# Patient Record
Sex: Male | Born: 1960 | Race: White | Hispanic: No | Marital: Married | Smoking: Never smoker
Health system: Southern US, Community
[De-identification: ages and names within clinical notes are randomized; demographics above are authoritative.]

## PROBLEM LIST (undated history)

## (undated) DIAGNOSIS — N200 Calculus of kidney: Secondary | ICD-10-CM

## (undated) DIAGNOSIS — E119 Type 2 diabetes mellitus without complications: Secondary | ICD-10-CM

---

## 2017-01-26 ENCOUNTER — Encounter (HOSPITAL_BASED_OUTPATIENT_CLINIC_OR_DEPARTMENT_OTHER): Payer: Self-pay | Admitting: Emergency Medicine

## 2017-01-26 ENCOUNTER — Other Ambulatory Visit: Payer: Self-pay

## 2017-01-26 DIAGNOSIS — E119 Type 2 diabetes mellitus without complications: Secondary | ICD-10-CM | POA: Insufficient documentation

## 2017-01-26 DIAGNOSIS — Z87442 Personal history of urinary calculi: Secondary | ICD-10-CM | POA: Diagnosis not present

## 2017-01-26 DIAGNOSIS — Z7982 Long term (current) use of aspirin: Secondary | ICD-10-CM | POA: Insufficient documentation

## 2017-01-26 DIAGNOSIS — N1330 Unspecified hydronephrosis: Secondary | ICD-10-CM | POA: Diagnosis not present

## 2017-01-26 DIAGNOSIS — N12 Tubulo-interstitial nephritis, not specified as acute or chronic: Secondary | ICD-10-CM | POA: Diagnosis not present

## 2017-01-26 DIAGNOSIS — R109 Unspecified abdominal pain: Secondary | ICD-10-CM | POA: Diagnosis not present

## 2017-01-26 DIAGNOSIS — R319 Hematuria, unspecified: Secondary | ICD-10-CM | POA: Insufficient documentation

## 2017-01-26 DIAGNOSIS — R1084 Generalized abdominal pain: Secondary | ICD-10-CM | POA: Diagnosis not present

## 2017-01-26 LAB — URINALYSIS, MICROSCOPIC (REFLEX)

## 2017-01-26 LAB — URINALYSIS, ROUTINE W REFLEX MICROSCOPIC
Glucose, UA: NEGATIVE mg/dL
Ketones, ur: 40 mg/dL — AB
Leukocytes, UA: NEGATIVE
Nitrite: NEGATIVE
Specific Gravity, Urine: >1.03 — ABNORMAL HIGH (ref 1.005–1.030)
pH: 5.5 (ref 5.0–8.0)

## 2017-01-26 NOTE — ED Triage Notes (Signed)
Pt presents with right flank pain , abdominal pain, right groin pain and blood in urine. PT reports pain started Monday and he flew back from [facility] and started having pink urine tonight.

## 2017-01-27 ENCOUNTER — Emergency Department (HOSPITAL_BASED_OUTPATIENT_CLINIC_OR_DEPARTMENT_OTHER)
Admission: EM | Admit: 2017-01-27 | Discharge: 2017-01-27 | Disposition: A | Discharge status: Home / Self Care | Payer: BLUE CROSS/BLUE SHIELD | Attending: Emergency Medicine | Admitting: Emergency Medicine

## 2017-01-27 ENCOUNTER — Emergency Department (HOSPITAL_BASED_OUTPATIENT_CLINIC_OR_DEPARTMENT_OTHER): Payer: BLUE CROSS/BLUE SHIELD

## 2017-01-27 DIAGNOSIS — R319 Hematuria, unspecified: Secondary | ICD-10-CM

## 2017-01-27 DIAGNOSIS — N1330 Unspecified hydronephrosis: Secondary | ICD-10-CM | POA: Diagnosis not present

## 2017-01-27 DIAGNOSIS — N12 Tubulo-interstitial nephritis, not specified as acute or chronic: Secondary | ICD-10-CM

## 2017-01-27 DIAGNOSIS — R109 Unspecified abdominal pain: Secondary | ICD-10-CM

## 2017-01-27 DIAGNOSIS — Z87442 Personal history of urinary calculi: Secondary | ICD-10-CM

## 2017-01-27 HISTORY — DX: Type 2 diabetes mellitus without complications: E11.9

## 2017-01-27 LAB — COMPREHENSIVE METABOLIC PANEL
ALT: 18 U/L (ref 17–63)
AST: 19 U/L (ref 15–41)
Albumin: 4.1 g/dL (ref 3.5–5.0)
Alkaline Phosphatase: 40 U/L (ref 38–126)
Anion gap: 10 (ref 5–15)
BILIRUBIN TOTAL: 0.8 mg/dL (ref 0.3–1.2)
BUN: 33 mg/dL — AB (ref 6–20)
CALCIUM: 9.2 mg/dL (ref 8.9–10.3)
CO2: 25 mmol/L (ref 22–32)
CREATININE: 2.11 mg/dL — AB (ref 0.61–1.24)
Chloride: 100 mmol/L — ABNORMAL LOW (ref 101–111)
GFR, EST AFRICAN AMERICAN: 39 mL/min — AB (ref >60)
GFR, EST NON AFRICAN AMERICAN: 34 mL/min — AB (ref >60)
Glucose, Bld: 113 mg/dL — ABNORMAL HIGH (ref 65–99)
Potassium: 4 mmol/L (ref 3.5–5.1)
Sodium: 135 mmol/L (ref 135–145)
TOTAL PROTEIN: 7.7 g/dL (ref 6.5–8.1)

## 2017-01-27 LAB — CBC
HEMATOCRIT: 43.8 % (ref 39.0–52.0)
Hemoglobin: 15.3 g/dL (ref 13.0–17.0)
MCH: 30.8 pg (ref 26.0–34.0)
MCHC: 34.9 g/dL (ref 30.0–36.0)
MCV: 88.1 fL (ref 78.0–100.0)
PLATELETS: 126 10*3/uL — AB (ref 150–400)
RBC: 4.97 MIL/uL (ref 4.22–5.81)
RDW: 13 % (ref 11.5–15.5)
WBC: 10.1 10*3/uL (ref 4.0–10.5)

## 2017-01-27 LAB — LIPASE, BLOOD: LIPASE: 43 U/L (ref 11–51)

## 2017-01-27 MED ORDER — MORPHINE SULFATE (PF) 4 MG/ML IV SOLN
4.0000 mg | Freq: Once | INTRAVENOUS | Status: AC
  Administered 2017-01-27: 4 mg via INTRAVENOUS
  Filled 2017-01-27: qty 1

## 2017-01-27 MED ORDER — CEPHALEXIN 500 MG PO CAPS: 500.0000 mg | ORAL_CAPSULE | Freq: Three times a day (TID) | ORAL | 0 refills | Status: DC

## 2017-01-27 MED ORDER — DOCUSATE SODIUM 250 MG PO CAPS: 250.0000 mg | ORAL_CAPSULE | Freq: Every day | ORAL | 0 refills | Status: DC

## 2017-01-27 MED ORDER — SODIUM CHLORIDE 0.9 % IV BOLUS (SEPSIS)
1000.0000 mL | Freq: Once | INTRAVENOUS | Status: AC
  Administered 2017-01-27: 1000 mL via INTRAVENOUS

## 2017-01-27 MED ORDER — HYDROCODONE-ACETAMINOPHEN 5-325 MG PO TABS: 1.0000 | ORAL_TABLET | ORAL | 0 refills | Status: DC | PRN

## 2017-01-27 MED ORDER — ONDANSETRON 8 MG PO TBDP: 8.0000 mg | ORAL_TABLET | Freq: Three times a day (TID) | ORAL | 0 refills | Status: DC | PRN

## 2017-01-27 MED ORDER — ONDANSETRON HCL 4 MG/2ML IJ SOLN
4.0000 mg | Freq: Once | INTRAMUSCULAR | Status: AC
  Administered 2017-01-27: 4 mg via INTRAVENOUS
  Filled 2017-01-27: qty 2

## 2017-01-27 MED ORDER — CEFTRIAXONE SODIUM 1 G IJ SOLR
1.0000 g | Freq: Once | INTRAMUSCULAR | Status: AC
  Administered 2017-01-27: 1 g via INTRAVENOUS
  Filled 2017-01-27: qty 10

## 2017-01-27 MED ORDER — IOPAMIDOL (ISOVUE-300) INJECTION 61%: 100.0000 mL | Freq: Once | INTRAVENOUS | Status: DC | PRN

## 2017-01-27 NOTE — Discharge Instructions (Signed)
You will need your kidney function rechecked in 1 week

## 2017-01-27 NOTE — ED Provider Notes (Signed)
[place] EMERGENCY DEPARTMENT Provider Note   CSN: [id number] Arrival date & time: 01/26/17  2331     History   Chief Complaint Chief Complaint  Patient presents with  . Abdominal Pain    HPI William Fry is a 57 y.o. male.  HPI Patient is a 57 year old male presents the emergency department several days of abdominal pain as well as urinary frequency.  He is also had new right flank pain with pain down into his right groin and tonight noticed new blood in his urine.  He is never had a history of kidney stones.  He reports several days ago he had severe abdominal distention with generalized crampy abdominal pain.  He reports urinary frequency without dysuria through the week.  He is never had symptoms like this before.  Reports decreased oral intake over the past several days.  No documented fevers or chills.  Reports some nausea without vomiting.  Denies diarrhea.  Symptoms are moderate in severity.    Past Medical History:  Diagnosis Date  . Diabetes mellitus without complication ([place])     There are no active problems to display for this patient.   History reviewed. No pertinent surgical history.     Home Medications    Prior to Admission medications   Medication Sig Start Date End Date Taking? Authorizing Provider  aspirin EC 81 MG tablet Take 81 mg by mouth daily.   Yes [provider]  fluticasone (FLONASE) 50 MCG/ACT nasal spray Place into both nostrils daily.   Yes [provider]  Omega-3 Fatty Acids (FISH OIL) 1000 MG CAPS Take by mouth.   Yes [provider]  cephALEXin (KEFLEX) 500 MG capsule Take 1 capsule (500 mg total) by mouth 3 (three) times daily. 01/27/17   Jola Schmidt, MD  docusate sodium (COLACE) 250 MG capsule Take 1 capsule (250 mg total) by mouth daily. 01/27/17   Jola Schmidt, MD  HYDROcodone-acetaminophen (NORCO/VICODIN) 5-325 MG tablet Take 1 tablet by mouth every 4 (four) hours as needed for moderate pain.  01/27/17   Jola Schmidt, MD  ondansetron (ZOFRAN ODT) 8 MG disintegrating tablet Take 1 tablet (8 mg total) by mouth every 8 (eight) hours as needed for nausea or vomiting. 01/27/17   Jola Schmidt, MD    Family History No family history on file.  Social History Social History   Tobacco Use  . Smoking status: Never Smoker  . Smokeless tobacco: Never Used  Substance Use Topics  . Alcohol use: No    Frequency: Never  . Drug use: No     Allergies   Patient has no known allergies.   Review of Systems Review of Systems  All other systems reviewed and are negative.    Physical Exam Updated Vital Signs BP 118/67 (BP Location: Left Arm)   Pulse 64   Temp 98.4 F (36.9 C) (Oral)   Resp 14   Ht 6' (1.829 m)   Wt 94.3 kg (208 lb)   SpO2 96%   BMI 28.21 kg/m   Physical Exam  Constitutional: He is oriented to person, place, and time. He appears well-developed and well-nourished.  HENT:  Head: Normocephalic and atraumatic.  Eyes: EOM are normal.  Neck: Normal range of motion.  Cardiovascular: Normal rate, regular rhythm, normal heart sounds and intact distal pulses.  Pulmonary/Chest: Effort normal and breath sounds normal. No respiratory distress.  Abdominal: Soft. He exhibits no distension. There is no tenderness.  Musculoskeletal: Normal range of motion.  Neurological:  He is alert and oriented to person, place, and time.  Skin: Skin is warm and dry.  Psychiatric: He has a normal mood and affect. Judgment normal.  Nursing note and vitals reviewed.    ED Treatments / Results  Labs (all labs ordered are listed, but only abnormal results are displayed) Labs Reviewed  URINALYSIS, ROUTINE W REFLEX MICROSCOPIC - Abnormal; Notable for the following components:      Result Value   Color, Urine BROWN (*)    APPearance CLOUDY (*)    Specific Gravity, Urine >1.030 (*)    Hgb urine dipstick LARGE (*)    Bilirubin Urine SMALL (*)    Ketones, ur 40 (*)    Protein, ur >300  (*)    All other components within normal limits  URINALYSIS, MICROSCOPIC (REFLEX) - Abnormal; Notable for the following components:   Bacteria, UA MANY (*)    Squamous Epithelial / LPF 0-5 (*)    All other components within normal limits  CBC - Abnormal; Notable for the following components:   Platelets 126 (*)    All other components within normal limits  COMPREHENSIVE METABOLIC PANEL - Abnormal; Notable for the following components:   Chloride 100 (*)    Glucose, Bld 113 (*)    BUN 33 (*)    Creatinine, Ser 2.11 (*)    GFR calc non Af Amer 34 (*)    GFR calc Af Amer 39 (*)    All other components within normal limits  URINE CULTURE  LIPASE, BLOOD    EKG  EKG Interpretation None       Radiology Ct Abdomen Pelvis Wo Contrast  Result Date: 01/27/2017 CLINICAL DATA:  Abdominal pain, right-sided flank and groin pain with hematuria EXAM: CT ABDOMEN AND PELVIS WITHOUT CONTRAST TECHNIQUE: Multidetector CT imaging of the abdomen and pelvis was performed following the standard protocol without IV contrast. COMPARISON:  None. FINDINGS: Lower chest: No acute abnormality. Hepatobiliary: No focal liver abnormality is seen. No gallstones, gallbladder wall thickening, or biliary dilatation. Pancreas: Unremarkable. No pancreatic ductal dilatation or surrounding inflammatory changes. Spleen: Normal in size without focal abnormality. Adrenals/Urinary Tract: Adrenal glands are within normal limits. There are intrarenal calculi bilaterally, measuring up to 4 mm midpole left, and 3 mm midpole right. Right perinephric fat stranding. Mild right hydronephrosis and hydroureter. No stones along the course of the right ureter. 3 mm stone within the dependent portion of the bladder. Stomach/Bowel: Stomach is within normal limits. Appendix appears normal. No evidence of bowel wall thickening, distention, or inflammatory changes. Sigmoid colon diverticular disease without acute inflammation. Vascular/Lymphatic:  Aortic atherosclerosis. No enlarged abdominal or pelvic lymph nodes. Reproductive: Prostate is unremarkable. Other: Negative for free air or free fluid. Small fat in the umbilical region. Musculoskeletal: No acute or significant osseous findings. IMPRESSION: 1. Mild right hydronephrosis and hydroureter; no stones seen along the course of the right ureter, however there is a 3 mm stone within the dependent portion of the bladder and findings are felt to be consistent with a recently passed stone. 2. There are intrarenal calculi bilaterally 3. Sigmoid colon diverticular disease without acute inflammation. Electronically Signed   By: Donavan Foil M.D.   On: 01/27/2017 01:52    Procedures Procedures (including critical care time)  Medications Ordered in ED Medications  morphine 4 MG/ML injection 4 mg (4 mg Intravenous Given 01/27/17 0054)  ondansetron (ZOFRAN) injection 4 mg (4 mg Intravenous Given 01/27/17 0053)  sodium chloride 0.9 % bolus 1,000 mL (0  mLs Intravenous Stopped 01/27/17 0308)  cefTRIAXone (ROCEPHIN) 1 g in dextrose 5 % 50 mL IVPB (0 g Intravenous Stopped 01/27/17 0308)     Initial Impression / Assessment and Plan / ED Course  I have reviewed the triage vital signs and the nursing notes.  Pertinent labs & imaging results that were available during my care of the patient were reviewed by me and considered in my medical decision making (see chart for details).     Urine sent for culture.  May have represented infected distal ureteral stone but now ureteral stone has passed.  He seems comfortable.  He feels better after symptomatic treatment here in the emergency department.  Urine culture sent.  Rocephin given in the emergency department.  Pain improved.  Nausea improved.  Feels much better.  Outpatient primary care follow-up.  He understands return to the ER for new or worsening symptoms.  Home with Keflex, Zofran, short course of pain medicine and stool softeners  Final Clinical  Impressions(s) / ED Diagnoses   Final diagnoses:  Acute abdominal pain  Pyelonephritis  Hematuria, unspecified type  History of renal stone    ED Discharge Orders        Ordered    ondansetron (ZOFRAN ODT) 8 MG disintegrating tablet  Every 8 hours PRN     01/27/17 0331    cephALEXin (KEFLEX) 500 MG capsule  3 times daily     01/27/17 0331    HYDROcodone-acetaminophen (NORCO/VICODIN) 5-325 MG tablet  Every 4 hours PRN     01/27/17 0331    docusate sodium (COLACE) 250 MG capsule  Daily     01/27/17 [place], Armida Vickroy, MD 01/27/17 [phone]

## 2017-01-28 LAB — URINE CULTURE: Culture: NO GROWTH

## 2017-02-02 DIAGNOSIS — N200 Calculus of kidney: Secondary | ICD-10-CM | POA: Diagnosis not present

## 2017-02-02 DIAGNOSIS — E782 Mixed hyperlipidemia: Secondary | ICD-10-CM | POA: Diagnosis not present

## 2017-02-02 DIAGNOSIS — N179 Acute kidney failure, unspecified: Secondary | ICD-10-CM | POA: Diagnosis not present

## 2017-02-02 DIAGNOSIS — E119 Type 2 diabetes mellitus without complications: Secondary | ICD-10-CM | POA: Diagnosis not present

## 2017-02-06 DIAGNOSIS — N202 Calculus of kidney with calculus of ureter: Secondary | ICD-10-CM | POA: Diagnosis not present

## 2017-02-24 DIAGNOSIS — N179 Acute kidney failure, unspecified: Secondary | ICD-10-CM | POA: Diagnosis not present

## 2017-05-04 DIAGNOSIS — E782 Mixed hyperlipidemia: Secondary | ICD-10-CM | POA: Diagnosis not present

## 2017-05-04 DIAGNOSIS — E119 Type 2 diabetes mellitus without complications: Secondary | ICD-10-CM | POA: Diagnosis not present

## 2017-05-04 DIAGNOSIS — Z0000 Encounter for general adult medical examination without abnormal findings: Secondary | ICD-10-CM | POA: Diagnosis not present

## 2017-05-04 DIAGNOSIS — Z125 Encounter for screening for malignant neoplasm of prostate: Secondary | ICD-10-CM | POA: Diagnosis not present

## 2017-12-18 DIAGNOSIS — R109 Unspecified abdominal pain: Secondary | ICD-10-CM | POA: Diagnosis not present

## 2018-01-16 DIAGNOSIS — D696 Thrombocytopenia, unspecified: Secondary | ICD-10-CM | POA: Diagnosis not present

## 2018-03-02 DIAGNOSIS — D696 Thrombocytopenia, unspecified: Secondary | ICD-10-CM | POA: Diagnosis not present

## 2018-03-21 ENCOUNTER — Telehealth: Payer: Self-pay | Admitting: Hematology

## 2018-03-21 NOTE — Telephone Encounter (Signed)
lmom for pt to return call to office re new patient appt. Mailed appt letter for 04/19/18 at 0830

## 2018-04-16 ENCOUNTER — Other Ambulatory Visit: Payer: Self-pay | Admitting: Hematology

## 2018-04-16 DIAGNOSIS — D696 Thrombocytopenia, unspecified: Secondary | ICD-10-CM

## 2018-04-19 ENCOUNTER — Inpatient Hospital Stay: Payer: BLUE CROSS/BLUE SHIELD | Attending: Hematology | Admitting: Hematology

## 2018-04-19 ENCOUNTER — Inpatient Hospital Stay: Payer: BLUE CROSS/BLUE SHIELD

## 2018-05-09 DIAGNOSIS — D696 Thrombocytopenia, unspecified: Secondary | ICD-10-CM | POA: Diagnosis not present

## 2018-05-09 DIAGNOSIS — E1169 Type 2 diabetes mellitus with other specified complication: Secondary | ICD-10-CM | POA: Diagnosis not present

## 2018-05-09 DIAGNOSIS — Z125 Encounter for screening for malignant neoplasm of prostate: Secondary | ICD-10-CM | POA: Diagnosis not present

## 2018-05-09 DIAGNOSIS — E782 Mixed hyperlipidemia: Secondary | ICD-10-CM | POA: Diagnosis not present

## 2018-05-09 DIAGNOSIS — Z1211 Encounter for screening for malignant neoplasm of colon: Secondary | ICD-10-CM | POA: Diagnosis not present

## 2018-05-11 DIAGNOSIS — Z0000 Encounter for general adult medical examination without abnormal findings: Secondary | ICD-10-CM | POA: Diagnosis not present

## 2018-07-12 DIAGNOSIS — H00024 Hordeolum internum left upper eyelid: Secondary | ICD-10-CM | POA: Diagnosis not present

## 2018-08-14 ENCOUNTER — Telehealth: Payer: Self-pay | Admitting: Hematology

## 2018-08-14 NOTE — Telephone Encounter (Signed)
Received call from [facility] to schedule patient for new patient appt. Unable to reach patient by phone and voice mailbox was full. Mailed appointment letter to inform patient of appointment 8/28 at 1145 am wioth Dr Maylon Peppers.

## 2018-08-29 NOTE — Progress Notes (Deleted)
[facility] NOTE  Patient Care Team: [facility], [facility] as PCP - General (Family Medicine)  HEME/ONC OVERVIEW: 1. Thrombocytopenia (plts 110-120k's since 2019)  ASSESSMENT & PLAN  Thrombocytopenia -I reviewed the patient's records in detail, including PCP clinic notes and lab studies dating back to 2019.  -I also independently reviewed the radiologic images of CT abdomen/pelvis in 2019, and agree with the findings documented -In summary, patient presented to [facility] ER in 01/2017 for nephrolithiasis, and labs showed an incidental mild thrombocytopenia (plts 129k).  CT abdomen/pelvis showed nephrolithiasis and mild hydronephrosis, but did not show any abnormalities in the liver or spleen.  Since then, patient has had periodic lab monitoring, and his platelet count has been stable between 110 and 120k's.  -Clinically, patient denies any abnormal symptoms of bleeding or excess bruising -Plts ____ today -I personally reviewed the peripheral blood smear, which showed _______ -I have ordered infectious (HIV, Hep B/C serologies), nutritional (B12 and folate) and coagulation studies to rule out common causes of thrombocytopenia -Furthermore, I have ordered abdominal US to assess for any new liver and spleen abnormalities since 2019 -Given the overall stable mild thrombocytopenia since 2019 without any clinically worrisome symptoms, I do not see any urgent indication for treatment, such as steroid -I counseled the patient on any concerning symptoms, such as unexplained fever, night sweats, lymphadenopathy, or abnormal bleeding/bruising, for which he should contact the clinic promptly  No orders of the defined types were placed in this encounter.   A total of more than {CHL ONC TIME VISIT - [id number] were spent face-to-face with the patient during this encounter and over half of that time was spent on counseling and coordination of care as outlined  above.    All questions were answered. The patient knows to call the clinic with any problems, questions or concerns.  Tish Men, MD 08/29/2018 8:48 AM   CHIEF COMPLAINTS/PURPOSE OF CONSULTATION:  "I am here for ***"  HISTORY OF PRESENTING ILLNESS:  William Fry 58 y.o. male is here because of thrombocytopenia.  He was found to have abnormal CBC from 01/2017. Platelet was 126k at that time. Since then, his plt count has been stable between 110 and 120k's. He denies recent bruising/bleeding, such as spontaneous epistaxis, hematuria, melena or hematochezia The patient denies history of liver disease, exposure to heparin, history of cardiac murmur/prior cardiovascular surgery or recent new medications He denies prior blood or platelet transfusions  I have reviewed his chart and materials related to his cancer extensively and collaborated history with the patient. Summary of oncologic history is as follows: Oncology History   No history exists.    MEDICAL HISTORY:  Past Medical History:  Diagnosis Date  . Diabetes mellitus without complication ([place])     SURGICAL HISTORY: No past surgical history on file.  SOCIAL HISTORY: Social History   Socioeconomic History  . Marital status: Married    Spouse name: Not on file  . Number of children: Not on file  . Years of education: Not on file  . Highest education level: Not on file  Occupational History  . Not on file  Social Needs  . Financial resource strain: Not on file  . Food insecurity    Worry: Not on file    Inability: Not on file  . Transportation needs    Medical: Not on file    Non-medical: Not on file  Tobacco Use  . Smoking status: Never Smoker  . Smokeless tobacco:  Never Used  Substance and Sexual Activity  . Alcohol use: No    Frequency: Never  . Drug use: No  . Sexual activity: Not on file  Lifestyle  . Physical activity    Days per week: Not on file    Minutes per session: Not on file  . Stress: Not on  file  Relationships  . Social Herbalist on phone: Not on file    Gets together: Not on file    Attends religious service: Not on file    Active member of club or organization: Not on file    Attends meetings of clubs or organizations: Not on file    Relationship status: Not on file  . Intimate partner violence    Fear of current or ex partner: Not on file    Emotionally abused: Not on file    Physically abused: Not on file    Forced sexual activity: Not on file  Other Topics Concern  . Not on file  Social History Narrative  . Not on file    FAMILY HISTORY: No family history on file.  ALLERGIES:  has No Known Allergies.  MEDICATIONS:  Current Outpatient Medications  Medication Sig Dispense Refill  . aspirin EC 81 MG tablet Take 81 mg by mouth daily.    . cephALEXin (KEFLEX) 500 MG capsule Take 1 capsule (500 mg total) by mouth 3 (three) times daily. 21 capsule 0  . docusate sodium (COLACE) 250 MG capsule Take 1 capsule (250 mg total) by mouth daily. 10 capsule 0  . fluticasone (FLONASE) 50 MCG/ACT nasal spray Place into both nostrils daily.    [place] [facility] HYDROcodone-acetaminophen (NORCO/VICODIN) 5-325 MG tablet Take 1 tablet by mouth every 4 (four) hours as needed for moderate pain. 15 tablet 0  . Omega-3 Fatty Acids (FISH OIL) 1000 MG CAPS Take by mouth.    . ondansetron (ZOFRAN ODT) 8 MG disintegrating tablet Take 1 tablet (8 mg total) by mouth every 8 (eight) hours as needed for nausea or vomiting. 10 tablet 0   No current facility-administered medications for this visit.     REVIEW OF SYSTEMS:   Constitutional: ( - ) fevers, ( - )  chills , ( - ) night sweats Eyes: ( - ) blurriness of vision, ( - ) double vision, ( - ) watery eyes Ears, nose, mouth, throat, and face: ( - ) mucositis, ( - ) sore throat Respiratory: ( - ) cough, ( - ) dyspnea, ( - ) wheezes Cardiovascular: ( - ) palpitation, ( - ) chest discomfort, ( - ) lower extremity swelling Gastrointestinal:  ( -  ) nausea, ( - ) heartburn, ( - ) change in bowel habits Skin: ( - ) abnormal skin rashes Lymphatics: ( - ) new lymphadenopathy, ( - ) easy bruising Neurological: ( - ) numbness, ( - ) tingling, ( - ) new weaknesses Behavioral/Psych: ( - ) mood change, ( - ) new changes  All other systems were reviewed with the patient and are negative.  PHYSICAL EXAMINATION: ECOG PERFORMANCE STATUS: {CHL ONC ECOG PS:[id number]}  There were no vitals filed for this visit. There were no vitals filed for this visit.  GENERAL: alert, no distress and comfortable SKIN: skin color, texture, turgor are normal, no rashes or significant lesions EYES: conjunctiva are pink and non-injected, sclera clear OROPHARYNX: no exudate, no erythema; lips, buccal mucosa, and tongue normal  NECK: supple, non-tender LYMPH:  no palpable lymphadenopathy in the cervical LUNGS: clear  to auscultation with normal breathing effort HEART: regular rate & rhythm, no murmurs, no lower extremity edema ABDOMEN: soft, non-tender, non-distended, normal bowel sounds Musculoskeletal: no cyanosis of digits and no clubbing  PSYCH: alert & oriented x 3, fluent speech NEURO: no focal motor/sensory deficits  LABORATORY DATA:  I have reviewed the data as listed Lab Results  Component Value Date   WBC 10.1 01/27/2017   HGB 15.3 01/27/2017   HCT 43.8 01/27/2017   MCV 88.1 01/27/2017   PLT 126 (L) 01/27/2017   Lab Results  Component Value Date   NA 135 01/27/2017   K 4.0 01/27/2017   CL 100 (L) 01/27/2017   CO2 25 01/27/2017    RADIOGRAPHIC STUDIES: I have personally reviewed the radiological images as listed and agreed with the findings in the report. No results found.  PATHOLOGY: I have reviewed the pathology reports as documented in the oncologist history.

## 2018-08-31 ENCOUNTER — Inpatient Hospital Stay: Payer: BC Managed Care – PPO

## 2018-08-31 ENCOUNTER — Inpatient Hospital Stay: Payer: BC Managed Care – PPO | Admitting: Hematology

## 2018-10-02 DIAGNOSIS — H00024 Hordeolum internum left upper eyelid: Secondary | ICD-10-CM | POA: Diagnosis not present

## 2019-02-09 DIAGNOSIS — Z20828 Contact with and (suspected) exposure to other viral communicable diseases: Secondary | ICD-10-CM | POA: Diagnosis not present

## 2019-02-12 ENCOUNTER — Emergency Department (HOSPITAL_COMMUNITY)
Admission: EM | Admit: 2019-02-12 | Discharge: 2019-02-12 | Disposition: A | Discharge status: Home / Self Care | Payer: BLUE CROSS/BLUE SHIELD | Attending: Emergency Medicine | Admitting: Emergency Medicine

## 2019-02-12 ENCOUNTER — Emergency Department (HOSPITAL_COMMUNITY): Payer: BLUE CROSS/BLUE SHIELD

## 2019-02-12 ENCOUNTER — Other Ambulatory Visit: Payer: Self-pay

## 2019-02-12 ENCOUNTER — Encounter (HOSPITAL_COMMUNITY): Payer: Self-pay | Admitting: Emergency Medicine

## 2019-02-12 DIAGNOSIS — Z79899 Other long term (current) drug therapy: Secondary | ICD-10-CM | POA: Diagnosis not present

## 2019-02-12 DIAGNOSIS — D696 Thrombocytopenia, unspecified: Secondary | ICD-10-CM | POA: Diagnosis not present

## 2019-02-12 DIAGNOSIS — E119 Type 2 diabetes mellitus without complications: Secondary | ICD-10-CM | POA: Diagnosis not present

## 2019-02-12 DIAGNOSIS — U071 COVID-19: Secondary | ICD-10-CM | POA: Diagnosis not present

## 2019-02-12 DIAGNOSIS — R0902 Hypoxemia: Secondary | ICD-10-CM | POA: Diagnosis not present

## 2019-02-12 DIAGNOSIS — R0602 Shortness of breath: Secondary | ICD-10-CM | POA: Diagnosis not present

## 2019-02-12 DIAGNOSIS — Z7982 Long term (current) use of aspirin: Secondary | ICD-10-CM | POA: Insufficient documentation

## 2019-02-12 DIAGNOSIS — R05 Cough: Secondary | ICD-10-CM | POA: Insufficient documentation

## 2019-02-12 DIAGNOSIS — G4489 Other headache syndrome: Secondary | ICD-10-CM | POA: Diagnosis not present

## 2019-02-12 LAB — COMPREHENSIVE METABOLIC PANEL
ALT: 36 U/L (ref 0–44)
AST: 28 U/L (ref 15–41)
Albumin: 3.7 g/dL (ref 3.5–5.0)
Alkaline Phosphatase: 43 U/L (ref 38–126)
Anion gap: 10 (ref 5–15)
BUN: 10 mg/dL (ref 6–20)
CO2: 23 mmol/L (ref 22–32)
Calcium: 8.6 mg/dL — ABNORMAL LOW (ref 8.9–10.3)
Chloride: 103 mmol/L (ref 98–111)
Creatinine, Ser: 1.1 mg/dL (ref 0.61–1.24)
GFR calc Af Amer: >60 mL/min (ref >60)
GFR calc non Af Amer: >60 mL/min (ref >60)
Glucose, Bld: 150 mg/dL — ABNORMAL HIGH (ref 70–99)
Potassium: 3.9 mmol/L (ref 3.5–5.1)
Sodium: 136 mmol/L (ref 135–145)
Total Bilirubin: 0.5 mg/dL (ref 0.3–1.2)
Total Protein: 6.8 g/dL (ref 6.5–8.1)

## 2019-02-12 LAB — CBC WITH DIFFERENTIAL/PLATELET
Abs Immature Granulocytes: 0.01 10*3/uL (ref 0.00–0.07)
Basophils Absolute: 0 10*3/uL (ref 0.0–0.1)
Basophils Relative: 0 %
Eosinophils Absolute: 0 10*3/uL (ref 0.0–0.5)
Eosinophils Relative: 0 %
HCT: 42.8 % (ref 39.0–52.0)
Hemoglobin: 14.8 g/dL (ref 13.0–17.0)
Immature Granulocytes: 0 %
Lymphocytes Relative: 30 %
Lymphs Abs: 0.9 10*3/uL (ref 0.7–4.0)
MCH: 32.7 pg (ref 26.0–34.0)
MCHC: 34.6 g/dL (ref 30.0–36.0)
MCV: 94.7 fL (ref 80.0–100.0)
Monocytes Absolute: 0.4 10*3/uL (ref 0.1–1.0)
Monocytes Relative: 14 %
Neutro Abs: 1.6 10*3/uL — ABNORMAL LOW (ref 1.7–7.7)
Neutrophils Relative %: 56 %
Platelets: 53 10*3/uL — ABNORMAL LOW (ref 150–400)
RBC: 4.52 MIL/uL (ref 4.22–5.81)
RDW: 12.6 % (ref 11.5–15.5)
WBC: 2.8 10*3/uL — ABNORMAL LOW (ref 4.0–10.5)
nRBC: 0 % (ref 0.0–0.2)

## 2019-02-12 MED ORDER — SODIUM CHLORIDE 0.9 % IV BOLUS
1000.0000 mL | Freq: Once | INTRAVENOUS | Status: AC
  Administered 2019-02-12: 11:00:00 1000 mL via INTRAVENOUS

## 2019-02-12 MED ORDER — BENZONATATE 100 MG PO CAPS: 100.0000 mg | ORAL_CAPSULE | Freq: Three times a day (TID) | ORAL | 0 refills | Status: DC

## 2019-02-12 MED ORDER — BENZONATATE 100 MG PO CAPS
100.0000 mg | ORAL_CAPSULE | Freq: Once | ORAL | Status: AC
  Administered 2019-02-12: 11:00:00 100 mg via ORAL
  Filled 2019-02-12: qty 1

## 2019-02-12 NOTE — ED Provider Notes (Signed)
[place] EMERGENCY DEPARTMENT Provider Note   CSN: [id number] Arrival date & time: 02/12/19  [id number]     History Chief Complaint  Patient presents with  . Shortness of Breath  . Covid+    William Fry is a 59 y.o. male with history of diabetes who presents with a cough.  Patient tested positive for Covid on Saturday.  States symptoms started on Thursday with chills, headache, cough.  He states that the whole family has tested positive for Covid as well.  Over the past couple of days he has had poor oral intake and severe coughing spells which will make him short of breath.  He just started Mucinex yesterday states he did not get any sleep last night. He decided to come to the ED today due to cough and SOB. No chest pain or leg swelling. No abdominal pain, N/V but has had some diarrhea. He is also taking multiple vitamins and zinc. No bleeding problems.  HPI     Past Medical History:  Diagnosis Date  . Diabetes mellitus without complication ([place])     There are no problems to display for this patient.   History reviewed. No pertinent surgical history.     No family history on file.  Social History   Tobacco Use  . Smoking status: Never Smoker  . Smokeless tobacco: Never Used  Substance Use Topics  . Alcohol use: No  . Drug use: No    Home Medications Prior to Admission medications   Medication Sig Start Date End Date Taking? Authorizing Provider  aspirin EC 81 MG tablet Take 81 mg by mouth daily.    [provider]  cephALEXin (KEFLEX) 500 MG capsule Take 1 capsule (500 mg total) by mouth 3 (three) times daily. 01/27/17   Jola Schmidt, MD  docusate sodium (COLACE) 250 MG capsule Take 1 capsule (250 mg total) by mouth daily. 01/27/17   Jola Schmidt, MD  fluticasone (FLONASE) 50 MCG/ACT nasal spray Place into both nostrils daily.    [provider]  HYDROcodone-acetaminophen (NORCO/VICODIN) 5-325 MG tablet Take 1 tablet by mouth every 4  (four) hours as needed for moderate pain. 01/27/17   Jola Schmidt, MD  Omega-3 Fatty Acids (FISH OIL) 1000 MG CAPS Take by mouth.    [provider]  ondansetron (ZOFRAN ODT) 8 MG disintegrating tablet Take 1 tablet (8 mg total) by mouth every 8 (eight) hours as needed for nausea or vomiting. 01/27/17   Jola Schmidt, MD    Allergies    Patient has no known allergies.  Review of Systems   Review of Systems  Constitutional: Positive for appetite change, chills and fever.  Respiratory: Positive for cough and shortness of breath. Negative for wheezing.   Cardiovascular: Negative for chest pain and leg swelling.  Gastrointestinal: Positive for diarrhea. Negative for abdominal pain, nausea and vomiting.  Hematological: Does not bruise/bleed easily.  All other systems reviewed and are negative.   Physical Exam Updated Vital Signs BP 119/82 (BP Location: Right Arm)   Pulse 80   Temp 98.3 F (36.8 C) (Oral)   Resp 20   Ht 6' (1.829 m)   Wt 95.7 kg   SpO2 98%   BMI 28.62 kg/m   Physical Exam Vitals and nursing note reviewed.  Constitutional:      General: He is not in acute distress.    Appearance: Normal appearance. He is well-developed. He is not ill-appearing.     Comments: Calm, cooperative NAD. Coughs  frequently with talking but at rest he feels better.  HENT:     Head: Normocephalic and atraumatic.     Mouth/Throat:     Mouth: Mucous membranes are dry.  Eyes:     General: No scleral icterus.       Right eye: No discharge.        Left eye: No discharge.     Conjunctiva/sclera: Conjunctivae normal.     Pupils: Pupils are equal, round, and reactive to light.  Cardiovascular:     Rate and Rhythm: Normal rate and regular rhythm.  Pulmonary:     Effort: Pulmonary effort is normal. No respiratory distress.     Breath sounds: Normal breath sounds.  Abdominal:     General: There is no distension.     Palpations: Abdomen is soft.     Tenderness: There is no abdominal  tenderness.  Musculoskeletal:     Cervical back: Normal range of motion.  Skin:    General: Skin is warm and dry.  Neurological:     Mental Status: He is alert and oriented to person, place, and time.  Psychiatric:        Behavior: Behavior normal.     ED Results / Procedures / Treatments   Labs (all labs ordered are listed, but only abnormal results are displayed) Labs Reviewed  COMPREHENSIVE METABOLIC PANEL - Abnormal; Notable for the following components:      Result Value   Glucose, Bld 150 (*)    Calcium 8.6 (*)    All other components within normal limits  CBC WITH DIFFERENTIAL/PLATELET - Abnormal; Notable for the following components:   WBC 2.8 (*)    Platelets 53 (*)    Neutro Abs 1.6 (*)    All other components within normal limits    EKG EKG Interpretation  Date/Time:  Tuesday February 12 2019 09:58:00 EST Ventricular Rate:  72 PR Interval:  132 QRS Duration: 78 QT Interval:  368 QTC Calculation: 402 R Axis:   8 Text Interpretation: Normal sinus rhythm Cannot rule out Inferior infarct , age undetermined Possible Anterior infarct , age undetermined Abnormal ECG No old tracing to compare Confirmed by Sherwood Gambler [phone]) on 02/12/2019 10:25:07 AM   Radiology DG Chest Port 1 View  Result Date: 02/12/2019 CLINICAL DATA:  Shortness of breath EXAM: PORTABLE CHEST 1 VIEW COMPARISON:  May 09, 2012 FINDINGS: Lungs are clear. Heart is upper normal in size with pulmonary vascularity normal. No adenopathy. No bone lesions. IMPRESSION: Lungs clear.  Stable cardiac silhouette.  No adenopathy. Electronically Signed   By: Lowella Grip III M.D.   On: 02/12/2019 09:58    Procedures Procedures (including critical care time)  Medications Ordered in ED Medications  sodium chloride 0.9 % bolus 1,000 mL (1,000 mLs Intravenous New Bag/Given 02/12/19 1038)  benzonatate (TESSALON) capsule 100 mg (100 mg Oral Given 02/12/19 1038)    ED Course  I have reviewed the triage vital  signs and the nursing notes.  Pertinent labs & imaging results that were available during my care of the patient were reviewed by me and considered in my medical decision making (see chart for details).  59 year old male with COVID presents with worsening cough with associated SOB. Pt was put on 2L via [place] for comfort. When I took him off he is maintaining sats >95% on RA. Exam is remarkable for dry mucous membranes and frequent coughing spells. Will obtain EKG, CXR, labs. Will give 1L bolus.  CBC shows  leukopenia (2.8) and significantly worse thrombocytopenia (58). He has not had any issues with bleeding recently. He was referred to heme/onc months ago but didn't f/u because of the pandemic. CMP is overall reassuring. He was given Tessalon which he feels has helped. CXR is negative. EKG is SR.  Will send rx for him and advised to f/u with his PCP and Dr. Maylon Peppers. He was given return precaution.  MDM Rules/Calculators/A&P                       Final Clinical Impression(s) / ED Diagnoses Final diagnoses:  COVID-19  Thrombocytopenia [facility])    Rx / DC Orders ED Discharge Orders    None       Recardo Evangelist, PA-C 02/12/19 1205    Sherwood Gambler, MD 02/13/19 [phone]

## 2019-02-12 NOTE — ED Notes (Signed)
Patient verbalizes understanding of discharge instructions. Opportunity for questioning and answers were provided. pt discharged from ED with family.   

## 2019-02-12 NOTE — Discharge Instructions (Signed)
Please drink plenty of fluids Take Tessalon every 8 hours as needed for cough Follow up with Dr. Maylon Peppers regarding low platelets Return if you are worsening

## 2019-02-12 NOTE — ED Triage Notes (Signed)
Pt in with incr sob, tested Covid+ 3 days ago, and entire family is positive as well. Pt became symptomatic 5 days ago w/HA, NP cough, sob, and diarrhea. Sats 94% on RA, temp 97.7

## 2019-02-12 NOTE — ED Notes (Signed)
[facility] to come and check monitor in room, as it will not take VS

## 2019-05-15 DIAGNOSIS — D696 Thrombocytopenia, unspecified: Secondary | ICD-10-CM | POA: Diagnosis not present

## 2019-05-15 DIAGNOSIS — Z0000 Encounter for general adult medical examination without abnormal findings: Secondary | ICD-10-CM | POA: Diagnosis not present

## 2019-05-15 DIAGNOSIS — Z125 Encounter for screening for malignant neoplasm of prostate: Secondary | ICD-10-CM | POA: Diagnosis not present

## 2019-05-15 DIAGNOSIS — E782 Mixed hyperlipidemia: Secondary | ICD-10-CM | POA: Diagnosis not present

## 2019-05-15 DIAGNOSIS — E1169 Type 2 diabetes mellitus with other specified complication: Secondary | ICD-10-CM | POA: Diagnosis not present

## 2019-06-21 ENCOUNTER — Other Ambulatory Visit: Payer: Self-pay | Admitting: Family

## 2019-06-21 DIAGNOSIS — D696 Thrombocytopenia, unspecified: Secondary | ICD-10-CM

## 2019-06-24 ENCOUNTER — Inpatient Hospital Stay: Payer: BC Managed Care – PPO | Attending: Family | Admitting: Family

## 2019-06-24 ENCOUNTER — Inpatient Hospital Stay: Payer: BC Managed Care – PPO

## 2019-06-24 ENCOUNTER — Encounter: Payer: Self-pay | Admitting: Family

## 2019-06-24 ENCOUNTER — Other Ambulatory Visit: Payer: Self-pay

## 2019-06-24 VITALS — BP 137/74 | HR 58 | Temp 98.8°F | Resp 18 | Ht 72.0 in | Wt 204.1 lb

## 2019-06-24 DIAGNOSIS — E119 Type 2 diabetes mellitus without complications: Secondary | ICD-10-CM | POA: Diagnosis not present

## 2019-06-24 DIAGNOSIS — Z7982 Long term (current) use of aspirin: Secondary | ICD-10-CM | POA: Diagnosis not present

## 2019-06-24 DIAGNOSIS — D696 Thrombocytopenia, unspecified: Secondary | ICD-10-CM

## 2019-06-24 DIAGNOSIS — R109 Unspecified abdominal pain: Secondary | ICD-10-CM | POA: Diagnosis not present

## 2019-06-24 DIAGNOSIS — C9110 Chronic lymphocytic leukemia of B-cell type not having achieved remission: Secondary | ICD-10-CM

## 2019-06-24 DIAGNOSIS — Z8616 Personal history of COVID-19: Secondary | ICD-10-CM | POA: Diagnosis not present

## 2019-06-24 LAB — CMP (CANCER CENTER ONLY)
ALT: 22 U/L (ref 0–44)
AST: 17 U/L (ref 15–41)
Albumin: 4.3 g/dL (ref 3.5–5.0)
Alkaline Phosphatase: 55 U/L (ref 38–126)
Anion gap: 7 (ref 5–15)
BUN: 19 mg/dL (ref 6–20)
CO2: 29 mmol/L (ref 22–32)
Calcium: 9.5 mg/dL (ref 8.9–10.3)
Chloride: 106 mmol/L (ref 98–111)
Creatinine: 1.39 mg/dL — ABNORMAL HIGH (ref 0.61–1.24)
GFR, Est AFR Am: >60 mL/min (ref >60)
GFR, Estimated: 56 mL/min — ABNORMAL LOW (ref >60)
Glucose, Bld: 202 mg/dL — ABNORMAL HIGH (ref 70–99)
Potassium: 4.3 mmol/L (ref 3.5–5.1)
Sodium: 142 mmol/L (ref 135–145)
Total Bilirubin: 0.5 mg/dL (ref 0.3–1.2)
Total Protein: 7.1 g/dL (ref 6.5–8.1)

## 2019-06-24 LAB — CBC WITH DIFFERENTIAL (CANCER CENTER ONLY)
Abs Immature Granulocytes: 0.02 10*3/uL (ref 0.00–0.07)
Basophils Absolute: 0 10*3/uL (ref 0.0–0.1)
Basophils Relative: 0 %
Eosinophils Absolute: 0 10*3/uL (ref 0.0–0.5)
Eosinophils Relative: 1 %
HCT: 39.5 % (ref 39.0–52.0)
Hemoglobin: 13.7 g/dL (ref 13.0–17.0)
Immature Granulocytes: 1 %
Lymphocytes Relative: 42 %
Lymphs Abs: 1.4 10*3/uL (ref 0.7–4.0)
MCH: 33.9 pg (ref 26.0–34.0)
MCHC: 34.7 g/dL (ref 30.0–36.0)
MCV: 97.8 fL (ref 80.0–100.0)
Monocytes Absolute: 0.3 10*3/uL (ref 0.1–1.0)
Monocytes Relative: 10 %
Neutro Abs: 1.5 10*3/uL — ABNORMAL LOW (ref 1.7–7.7)
Neutrophils Relative %: 46 %
Platelet Count: 57 10*3/uL — ABNORMAL LOW (ref 150–400)
RBC: 4.04 MIL/uL — ABNORMAL LOW (ref 4.22–5.81)
RDW: 12.3 % (ref 11.5–15.5)
WBC Count: 3.2 10*3/uL — ABNORMAL LOW (ref 4.0–10.5)
nRBC: 0 % (ref 0.0–0.2)

## 2019-06-24 LAB — PLATELET BY CITRATE

## 2019-06-24 LAB — LACTATE DEHYDROGENASE: LDH: 172 U/L (ref 98–192)

## 2019-06-24 LAB — SAVE SMEAR(SSMR), FOR PROVIDER SLIDE REVIEW

## 2019-06-24 LAB — SURGICAL PATHOLOGY

## 2019-06-24 NOTE — Progress Notes (Signed)
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  He had Covid in February and since that time his platelet count has remained in the 50's. So far, he is asymptomatic with this.  No episodes of bleeding. No abnormal or excessive bruising or petechiae.  Since having Covid has has noted increased fatigue by the afternoon as well as occasional musculoskeletal twinges of pain in the left chest with over exertion.  He is typically quite active golfing, walking, doing yoga and using a Peloton.  He states that his father also has history of thrombocytopenia (counts 80-100) and also has CLL.  The patient's Neutrophils are at 46% and lymphocytes 42%. WBC count 3.2 and Hgb 13.7   His last CT scan of the abdomen and pelvis showed no liver or spleen issues.  He does have a history of kidney stones.  He has had two colonoscopies in the past due to his mothers history of diverticulitis requiring part of her colon be removed.  No fever, chills, n/v, cough, rash, dizziness, SOB, chest pain, palpitations or changes in bowel or bladder habits.   He has some mild abdominal discomfort in the left upper quadrant on exam.  No swelling, tenderness, numbness or tingling in his extremities.  No falls or syncopal episodes.  He states that he is diabetic but manages with diet and exercise. He is not currently on any medication for this.  He has maintained a good appetite and is staying well hydrated. Her weight is stable.  He does not smoke, drink alcoholic beverages or use recreational drugs.  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   ROS: All other 10 point review of systems is negative.   PAST MEDICAL HISTORY:   Past Medical History:  Diagnosis Date  . Diabetes mellitus without complication ([place])     ALLERGIES: No Known Allergies    MEDICATIONS:  Current Outpatient Medications on File Prior to Visit  Medication Sig Dispense Refill  . acetaminophen (TYLENOL) 500 MG tablet Take 500 mg by mouth every 6 (six) hours as needed for mild pain.    . Ascorbic Acid (VITAMIN C) 1000 MG tablet Take 500 mg by mouth daily.    Marland Kitchen aspirin EC 81 MG tablet Take 81 mg by mouth daily.    . cholecalciferol (VITAMIN D3) 25 MCG (1000 UNIT) tablet Take 1,000 Units by mouth daily.    Marland Kitchen ELDERBERRY PO Take 1 tablet by mouth daily.    . fluticasone (FLONASE) 50 MCG/ACT nasal spray Place 2 sprays into both nostrils daily.     . Omega-3 Fatty Acids (FISH OIL) 1000 MG CAPS Take 1,000 mg by mouth daily.     Marland Kitchen zinc gluconate 50 MG tablet Take 50 mg by mouth daily.     No current facility-administered medications on file prior to visit.     PAST SURGICAL HISTORY No past surgical history on file.  FAMILY HISTORY: No family history on file.  SOCIAL HISTORY:  reports that he has never smoked. He has never used smokeless tobacco. He reports that he does not  drink alcohol and does not use drugs.  PERFORMANCE STATUS: The patient's performance status is 1 - Symptomatic but completely ambulatory  PHYSICAL EXAM: Most Recent Vital Signs: Blood pressure 137/74, pulse (!) 58, temperature 98.8 F (37.1 C), temperature source Oral, resp. rate 18, height 6' (1.829 m), weight 204 lb 1.9 oz (92.6 kg), SpO2 100 %. BP 137/74 (BP Location: Left Arm, Patient Position: Sitting)   Pulse (!) 58   Temp 98.8 F (37.1 C) (Oral)   Resp 18   Ht 6' (1.829 m)   Wt 204 lb 1.9 oz (92.6 kg)   SpO2 100%   BMI 27.68 kg/m   General Appearance:    Alert, cooperative, no distress, appears stated age  Head:    Normocephalic, without  obvious abnormality, atraumatic  Eyes:    PERRL, conjunctiva/corneas clear, EOM's intact, fundi    benign, both eyes             Throat:   Lips, mucosa, and tongue normal; teeth and gums normal  Neck:   Supple, symmetrical, trachea midline, no adenopathy;       thyroid:  No enlargement/tenderness/nodules; no carotid   bruit or JVD  Back:     Symmetric, no curvature, ROM normal, no CVA tenderness  Lungs:     Clear to auscultation bilaterally, respirations unlabored  Chest wall:    No tenderness or deformity  Heart:    Regular rate and rhythm, S1 and S2 normal, no murmur, rub   or gallop  Abdomen:     Soft, mild tenderness in left upper quadrant on exam, bowel sounds active all four quadrants,    no masses, no organomegaly        Extremities:   Extremities normal, atraumatic, no cyanosis or edema  Pulses:   2+ and symmetric all extremities  Skin:   Skin color, texture, turgor normal, no rashes or lesions  Lymph nodes:   Cervical, supraclavicular, and axillary nodes normal  Neurologic:   CNII-XII intact. Normal strength, sensation and reflexes      throughout    LABORATORY DATA:  Results for orders placed or performed in visit on 06/24/19 (from the past 48 hour(s))  CBC with Differential ([place] Only)     Status: Abnormal   Collection Time: 06/24/19  8:15 AM  Result Value Ref Range   WBC Count 3.2 (L) 4.0 - 10.5 K/uL   RBC 4.04 (L) 4.22 - 5.81 MIL/uL   Hemoglobin 13.7 13.0 - 17.0 g/dL   HCT 39.5 39 - 52 %   MCV 97.8 80.0 - 100.0 fL   MCH 33.9 26.0 - 34.0 pg   MCHC 34.7 30.0 - 36.0 g/dL   RDW 12.3 11.5 - 15.5 %   Platelet Count 57 (L) 150 - 400 K/uL    Comment: EDTA platelet count consistent with citrate.   nRBC 0.0 0.0 - 0.2 %   Neutrophils Relative % 46 %   Neutro Abs 1.5 (L) 1.7 - 7.7 K/uL   Lymphocytes Relative 42 %   Lymphs Abs 1.4 0.7 - 4.0 K/uL   Monocytes Relative 10 %   Monocytes Absolute 0.3 0 - 1 K/uL   Eosinophils Relative 1 %   Eosinophils Absolute 0.0  0 - 0 K/uL   Basophils Relative 0 %   Basophils Absolute 0.0 0 - 0 K/uL   Immature Granulocytes 1 %   Abs Immature Granulocytes 0.02 0.00 - 0.07 K/uL    Comment: Performed at [facility]  at [facility], [address], [place], [place] [address]  [place] ([place] only)     Status: Abnormal   Collection Time: 06/24/19  8:15 AM  Result Value Ref Range   Sodium 142 135 - 145 mmol/L   Potassium 4.3 3.5 - 5.1 mmol/L   Chloride 106 98 - 111 mmol/L   CO2 29 22 - 32 mmol/L   Glucose, Bld 202 (H) 70 - 99 mg/dL    Comment: Glucose reference range applies only to samples taken after fasting for at least 8 hours.   BUN 19 6 - 20 mg/dL   Creatinine 1.39 (H) 0.61 - 1.24 mg/dL   Calcium 9.5 8.9 - 10.3 mg/dL   Total Protein 7.1 6.5 - 8.1 g/dL   Albumin 4.3 3.5 - 5.0 g/dL   AST 17 15 - 41 U/L   ALT 22 0 - 44 U/L   Alkaline Phosphatase 55 38 - 126 U/L   Total Bilirubin 0.5 0.3 - 1.2 mg/dL   GFR, Est Non Af Am 56 (L) >60 mL/min   GFR, Est AFR Am >60 >60 mL/min   Anion gap 7 5 - 15    Comment: Performed at [facility] at [facility], [address] [place], [place] [address]  Save Smear [facility])     Status: None   Collection Time: 06/24/19  8:15 AM  Result Value Ref Range   Smear Review SMEAR STAINED AND AVAILABLE FOR REVIEW     Comment: Performed at [facility] at [facility], [address] [place], [place] [address]  Platelet by [facility]     Status: None   Collection Time: 06/24/19  8:15 AM  Result Value Ref Range   Platelet CT in [place] consistent with PLTS     Comment: Performed at [facility] at [facility], [address], [place], [place] [address]      RADIOGRAPHY: No results found.     PATHOLOGY: None   ASSESSMENT/PLAN: Mr. Goldfarb is a very pleasant 59 yo caucasian gentleman [place] with a a long standing history of mild thrombocytopenia (usually  100-120's).  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  We will see what his flow cytometry shows. Once we have all of his lab work in hand we will schedule his follow-up.   All questions were answered and he is in agreement with the plan. He will contact our office with any questions or concerns. We can certainly see him sooner if needed.    He was discussed with and also seen by Dr. Marin Olp and he is in agreement with the aforementioned.   Laverna Peace, NP   ADDENDUM: I saw and examined Mr. Reaver with Judson Roch.  He is very nice.  I agree with the above assessment.  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  I looked at his blood smear.  I do not see anything of blood smear that looks like a hematologic malignancy.  I saw nothing that look like myelodysplasia.  He had a few large platelets.  He does not have any liver issue.  There is no obvious splenomegaly on exam.  The fact that he had the COVID infection could certainly have exacerbated the thrombocytopenia.  He is totally asymptomatic right now.  I do not see a role  for a bone marrow biopsy right yet.  I think his platelet count drops significantly, then we probably will have to consider this in the future.  I think we can probably try to get him through the summertime.  We will get him back after Labor Day and we will see how his platelet count is trending.  We spent about 45 minutes with him.  He is very nice.  It was nice talking about William Fry.  We answered his questions.  We reviewed the lab work.  We went over our recommendations.  Lattie Haw, MD

## 2019-06-25 ENCOUNTER — Telehealth: Payer: Self-pay | Admitting: Family

## 2019-06-25 LAB — FLOW CYTOMETRY

## 2019-06-25 NOTE — Telephone Encounter (Signed)
No los 6/21 °

## 2019-06-26 ENCOUNTER — Telehealth: Payer: Self-pay | Admitting: Family

## 2019-06-26 ENCOUNTER — Telehealth: Payer: Self-pay | Admitting: *Deleted

## 2019-06-26 NOTE — Telephone Encounter (Signed)
Appointments scheduled calendar printed & mailed per 6/23 sch msg

## 2019-06-26 NOTE — Telephone Encounter (Signed)
Pt notified per order of S. [place] NP that the "flow cytometry is negative!!!! NO CLL!!!!!!! Message sent to scheduling for f/u in 3 months."  Pt appreciative of call and has no questions or concerns at this time.

## 2019-06-26 NOTE — Telephone Encounter (Signed)
-----   Message from Eliezer Bottom, NP sent at 06/26/2019 10:01 AM EDT ----- Flow cytometry is negative!!!! NO CLL!!!!!!!!! WOO HOO!!!!!!! Scheduling message sent to [facility] for follow-up with lab in 3 months :)   ----- Message ----- From: Interface, Lab In Three Zero One Sent: 06/24/2019   4:06 PM EDT To: Eliezer Bottom, NP

## 2019-07-03 ENCOUNTER — Emergency Department (HOSPITAL_BASED_OUTPATIENT_CLINIC_OR_DEPARTMENT_OTHER)
Admission: EM | Admit: 2019-07-03 | Discharge: 2019-07-03 | Disposition: A | Discharge status: Home / Self Care | Payer: BC Managed Care – PPO | Attending: Emergency Medicine | Admitting: Emergency Medicine

## 2019-07-03 ENCOUNTER — Emergency Department (HOSPITAL_BASED_OUTPATIENT_CLINIC_OR_DEPARTMENT_OTHER): Payer: BC Managed Care – PPO

## 2019-07-03 ENCOUNTER — Encounter (HOSPITAL_BASED_OUTPATIENT_CLINIC_OR_DEPARTMENT_OTHER): Payer: Self-pay

## 2019-07-03 ENCOUNTER — Other Ambulatory Visit: Payer: Self-pay

## 2019-07-03 DIAGNOSIS — R319 Hematuria, unspecified: Secondary | ICD-10-CM | POA: Insufficient documentation

## 2019-07-03 DIAGNOSIS — K7689 Other specified diseases of liver: Secondary | ICD-10-CM | POA: Diagnosis not present

## 2019-07-03 DIAGNOSIS — N1330 Unspecified hydronephrosis: Secondary | ICD-10-CM | POA: Insufficient documentation

## 2019-07-03 DIAGNOSIS — N201 Calculus of ureter: Secondary | ICD-10-CM | POA: Diagnosis not present

## 2019-07-03 DIAGNOSIS — E119 Type 2 diabetes mellitus without complications: Secondary | ICD-10-CM | POA: Insufficient documentation

## 2019-07-03 DIAGNOSIS — R112 Nausea with vomiting, unspecified: Secondary | ICD-10-CM | POA: Insufficient documentation

## 2019-07-03 DIAGNOSIS — R109 Unspecified abdominal pain: Secondary | ICD-10-CM | POA: Diagnosis not present

## 2019-07-03 DIAGNOSIS — R310 Gross hematuria: Secondary | ICD-10-CM | POA: Diagnosis not present

## 2019-07-03 DIAGNOSIS — K760 Fatty (change of) liver, not elsewhere classified: Secondary | ICD-10-CM | POA: Diagnosis not present

## 2019-07-03 DIAGNOSIS — N132 Hydronephrosis with renal and ureteral calculous obstruction: Secondary | ICD-10-CM | POA: Diagnosis not present

## 2019-07-03 DIAGNOSIS — R61 Generalized hyperhidrosis: Secondary | ICD-10-CM | POA: Insufficient documentation

## 2019-07-03 DIAGNOSIS — R509 Fever, unspecified: Secondary | ICD-10-CM | POA: Diagnosis not present

## 2019-07-03 HISTORY — DX: Calculus of kidney: N20.0

## 2019-07-03 LAB — URINALYSIS, MICROSCOPIC (REFLEX): RBC / HPF: >50 RBC/hpf (ref 0–5)

## 2019-07-03 LAB — CBC WITH DIFFERENTIAL/PLATELET
Abs Immature Granulocytes: 0.05 10*3/uL (ref 0.00–0.07)
Basophils Absolute: 0 10*3/uL (ref 0.0–0.1)
Basophils Relative: 0 %
Eosinophils Absolute: 0 10*3/uL (ref 0.0–0.5)
Eosinophils Relative: 0 %
HCT: 43.6 % (ref 39.0–52.0)
Hemoglobin: 15.4 g/dL (ref 13.0–17.0)
Immature Granulocytes: 1 %
Lymphocytes Relative: 7 %
Lymphs Abs: 0.6 10*3/uL — ABNORMAL LOW (ref 0.7–4.0)
MCH: 34 pg (ref 26.0–34.0)
MCHC: 35.3 g/dL (ref 30.0–36.0)
MCV: 96.2 fL (ref 80.0–100.0)
Monocytes Absolute: 0.3 10*3/uL (ref 0.1–1.0)
Monocytes Relative: 4 %
Neutro Abs: 7.4 10*3/uL (ref 1.7–7.7)
Neutrophils Relative %: 88 %
Platelets: 64 10*3/uL — ABNORMAL LOW (ref 150–400)
RBC: 4.53 MIL/uL (ref 4.22–5.81)
RDW: 12.9 % (ref 11.5–15.5)
WBC: 8.4 10*3/uL (ref 4.0–10.5)
nRBC: 0 % (ref 0.0–0.2)

## 2019-07-03 LAB — BASIC METABOLIC PANEL
Anion gap: 12 (ref 5–15)
BUN: 16 mg/dL (ref 6–20)
CO2: 24 mmol/L (ref 22–32)
Calcium: 9.5 mg/dL (ref 8.9–10.3)
Chloride: 101 mmol/L (ref 98–111)
Creatinine, Ser: 1.22 mg/dL (ref 0.61–1.24)
GFR calc Af Amer: >60 mL/min (ref >60)
GFR calc non Af Amer: >60 mL/min (ref >60)
Glucose, Bld: 187 mg/dL — ABNORMAL HIGH (ref 70–99)
Potassium: 4.2 mmol/L (ref 3.5–5.1)
Sodium: 137 mmol/L (ref 135–145)

## 2019-07-03 LAB — URINALYSIS, ROUTINE W REFLEX MICROSCOPIC
Bilirubin Urine: NEGATIVE
Glucose, UA: NEGATIVE mg/dL
Ketones, ur: >80 mg/dL — AB
Leukocytes,Ua: NEGATIVE
Nitrite: NEGATIVE
Protein, ur: NEGATIVE mg/dL
Specific Gravity, Urine: 1.02 (ref 1.005–1.030)
pH: 7 (ref 5.0–8.0)

## 2019-07-03 MED ORDER — ONDANSETRON HCL 4 MG/2ML IJ SOLN
INTRAMUSCULAR | Status: AC
  Administered 2019-07-03: 4 mg via INTRAVENOUS
  Filled 2019-07-03: qty 2

## 2019-07-03 MED ORDER — TAMSULOSIN HCL 0.4 MG PO CAPS: 0.4000 mg | ORAL_CAPSULE | Freq: Every day | ORAL | 0 refills | Status: AC

## 2019-07-03 MED ORDER — KETOROLAC TROMETHAMINE 30 MG/ML IJ SOLN
30.0000 mg | Freq: Once | INTRAMUSCULAR | Status: AC
  Administered 2019-07-03: 30 mg via INTRAVENOUS
  Filled 2019-07-03: qty 1

## 2019-07-03 MED ORDER — SODIUM CHLORIDE 0.9 % IV BOLUS
1000.0000 mL | Freq: Once | INTRAVENOUS | Status: AC
  Administered 2019-07-03: 1000 mL via INTRAVENOUS

## 2019-07-03 MED ORDER — PROMETHAZINE HCL 25 MG PO TABS: 25.0000 mg | ORAL_TABLET | Freq: Four times a day (QID) | ORAL | 0 refills | Status: DC | PRN

## 2019-07-03 MED ORDER — HYDROMORPHONE HCL 1 MG/ML IJ SOLN
1.0000 mg | Freq: Once | INTRAMUSCULAR | Status: AC
  Administered 2019-07-03: 1 mg via INTRAVENOUS
  Filled 2019-07-03: qty 1

## 2019-07-03 MED ORDER — OXYCODONE-ACETAMINOPHEN 5-325 MG PO TABS
1.0000 | ORAL_TABLET | Freq: Once | ORAL | Status: AC
  Administered 2019-07-03: 1 via ORAL
  Filled 2019-07-03: qty 1

## 2019-07-03 MED ORDER — ONDANSETRON HCL 4 MG/2ML IJ SOLN: 4.0000 mg | Freq: Once | INTRAMUSCULAR | Status: AC

## 2019-07-03 MED ORDER — OXYCODONE-ACETAMINOPHEN 5-325 MG PO TABS: 2.0000 | ORAL_TABLET | Freq: Four times a day (QID) | ORAL | 0 refills | Status: AC | PRN

## 2019-07-03 MED ORDER — TAMSULOSIN HCL 0.4 MG PO CAPS
0.4000 mg | ORAL_CAPSULE | Freq: Once | ORAL | Status: AC
  Administered 2019-07-03: 0.4 mg via ORAL
  Filled 2019-07-03: qty 1

## 2019-07-03 NOTE — ED Triage Notes (Signed)
Pt c/o left side abd pain started this am-states feel like kidney stone pain-NAD-steady gait

## 2019-07-03 NOTE — ED Provider Notes (Signed)
[facility] EMERGENCY DEPARTMENT Provider Note   CSN: [id number] Arrival date & time: 07/03/19  Apr 04, 1205     History Chief Complaint  Patient presents with  . Abdominal Pain    William Fry is a 59 y.o. male who with history of thrombocytopenia, kidney stones presents to the ED for evaluation of abdominal pain that was sudden at around 10 AM today.  Located in the left lateral abdomen with radiation to the flank.  States it feels similar to previous kidney stones.  Initially severe.  States he cannot get comfortable and had to stand up.  Associated with subjective fevers, chills, nausea and vomiting x2.  Has noticed decreased in urination.  Had kidney stone in Apr 04, 2017 that he passed on his own without intervention.  Denies dysuria, hematuria.  No bowel movement changes.  No chest pain or shortness of breath.  No anticoagulants.  No interventions.  No modifying factors.  No distal tingling or loss of sensation of extremities.  HPI     Past Medical History:  Diagnosis Date  . Diabetes mellitus without complication ([place])   . Kidney stone     There are no problems to display for this patient.   History reviewed. No pertinent surgical history.     No family history on file.  Social History   Tobacco Use  . Smoking status: Never Smoker  . Smokeless tobacco: Never Used  Vaping Use  . Vaping Use: Never used  Substance Use Topics  . Alcohol use: No  . Drug use: No    Home Medications Prior to Admission medications   Medication Sig Start Date End Date Taking? Authorizing Provider  acetaminophen (TYLENOL) 500 MG tablet Take 500 mg by mouth every 6 (six) hours as needed for mild pain.    [provider]  Ascorbic Acid (VITAMIN C) 1000 MG tablet Take 500 mg by mouth daily.    [provider]  aspirin EC 81 MG tablet Take 81 mg by mouth daily.    [provider]  cholecalciferol (VITAMIN D3) 25 MCG (1000 UNIT) tablet Take 1,000 Units by mouth daily.     [provider]  ELDERBERRY PO Take 1 tablet by mouth daily.    [provider]  fluticasone (FLONASE) 50 MCG/ACT nasal spray Place 2 sprays into both nostrils daily.     [provider]  Omega-3 Fatty Acids (FISH OIL) 1000 MG CAPS Take 1,000 mg by mouth daily.     [provider]  oxyCODONE-acetaminophen (PERCOCET/ROXICET) 5-325 MG tablet Take 2 tablets by mouth every 6 (six) hours as needed for up to 12 days for severe pain. 07/03/19 07/15/19  Kinnie Feil, PA-C  promethazine (PHENERGAN) 25 MG tablet Take 1 tablet (25 mg total) by mouth every 6 (six) hours as needed for nausea or vomiting. 07/03/19   Kinnie Feil, PA-C  tamsulosin (FLOMAX) 0.4 MG CAPS capsule Take 1 capsule (0.4 mg total) by mouth daily for 15 days. 07/03/19 07/18/19  Kinnie Feil, PA-C  zinc gluconate 50 MG tablet Take 50 mg by mouth daily.    [provider]    Allergies    Patient has no known allergies.  Review of Systems   Review of Systems  Constitutional: Positive for diaphoresis.  Gastrointestinal: Positive for abdominal pain, nausea and vomiting.  Genitourinary: Positive for decreased urine volume, flank pain and hematuria.  All other systems reviewed and are negative.  Physical Exam Updated Vital Signs BP 138/73  Pulse 67   Temp 97.8 F (36.6 C) (Oral)   Resp 17   Ht 6' (1.829 m)   Wt 91.6 kg   SpO2 99%   BMI 27.40 kg/m   Physical Exam Vitals and nursing note reviewed.  Constitutional:      Appearance: He is well-developed.     Comments: Non toxic.  HENT:     Head: Normocephalic and atraumatic.     Nose: Nose normal.  Eyes:     Conjunctiva/sclera: Conjunctivae normal.  Cardiovascular:     Rate and Rhythm: Normal rate and regular rhythm.     Heart sounds: Normal heart sounds.     Comments: 1+ radial and DP pulses bilaterally. Pulmonary:     Effort: Pulmonary effort is normal.     Breath sounds: Normal breath sounds.  Abdominal:       General: Bowel sounds are normal.     Palpations: Abdomen is soft.     Tenderness: There is abdominal tenderness in the left lower quadrant. There is left CVA tenderness.     Comments: Left lower and mid/lateral abdominal tenderness. Left CVAT.  No G/R/R. No suprapubic tendernes. Negative Murphy's and McBurney's.  Active bowel sounds lower quadrants.  No abdominal pulsatile mass.  Musculoskeletal:        General: Normal range of motion.     Cervical back: Normal range of motion.  Skin:    General: Skin is warm and dry.     Capillary Refill: Capillary refill takes less than 2 seconds.  Neurological:     Mental Status: He is alert.     Comments: Sensation to light touch and strength intact in upper and lower extremities  Psychiatric:        Behavior: Behavior normal.     ED Results / Procedures / Treatments   Labs (all labs ordered are listed, but only abnormal results are displayed) Labs Reviewed  URINALYSIS, ROUTINE W REFLEX MICROSCOPIC - Abnormal; Notable for the following components:      Result Value   APPearance CLOUDY (*)    Hgb urine dipstick LARGE (*)    Ketones, ur >80 (*)    All other components within normal limits  URINALYSIS, MICROSCOPIC (REFLEX) - Abnormal; Notable for the following components:   Bacteria, UA MANY (*)    All other components within normal limits  CBC WITH DIFFERENTIAL/PLATELET - Abnormal; Notable for the following components:   Platelets 64 (*)    Lymphs Abs 0.6 (*)    All other components within normal limits  BASIC METABOLIC PANEL - Abnormal; Notable for the following components:   Glucose, Bld 187 (*)    All other components within normal limits    EKG None  Radiology CT Renal Stone Study  Result Date: 07/03/2019 CLINICAL DATA:  Left flank pain with gross hematuria. EXAM: CT ABDOMEN AND PELVIS WITHOUT CONTRAST TECHNIQUE: Multidetector CT imaging of the abdomen and pelvis was performed following the standard protocol without IV  contrast. COMPARISON:  January 27, 2017. FINDINGS: Lower chest: The lung bases are clear. The heart size is normal. Hepatobiliary: There is decreased hepatic attenuation suggestive of hepatic steatosis. Normal gallbladder.There is no biliary ductal dilation. Pancreas: Normal contours without ductal dilatation. No peripancreatic fluid collection. Spleen: Unremarkable. Adrenals/Urinary Tract: --Adrenal glands: Unremarkable. --Right kidney/ureter: There are multiple nonobstructing stones involving the right kidney measuring up to approximately 5 mm. --Left kidney/ureter: There is mild right-sided hydronephrosis secondary to obstructing 6 mm stone in the proximal left ureter (axial series  2, image 47). There are multiple additional nonobstructing stones in the left kidney. --Urinary bladder: Unremarkable. Stomach/Bowel: --Stomach/Duodenum: No hiatal hernia or other gastric abnormality. Normal duodenal course and caliber. --Small bowel: Unremarkable. --Colon: There are scattered colonic diverticula without CT evidence for diverticulitis. --Appendix: Normal. Vascular/Lymphatic: Normal course and caliber of the major abdominal vessels. --No retroperitoneal lymphadenopathy. --No mesenteric lymphadenopathy. --No pelvic or inguinal lymphadenopathy. Reproductive: Unremarkable Other: No ascites or free air. The abdominal wall is normal. Musculoskeletal. No acute displaced fractures. IMPRESSION: 1. Mild left-sided hydronephrosis secondary to obstructing 6 mm stone in the proximal left ureter. 2. Bilateral nonobstructing nephrolithiasis. 3. Hepatic steatosis. 4. Scattered colonic diverticula without CT evidence for diverticulitis. Electronically Signed   By: Constance Holster M.D.   On: 07/03/2019 14:46    Procedures Procedures (including critical care time)  Medications Ordered in ED Medications  HYDROmorphone (DILAUDID) injection 1 mg (1 mg Intravenous Given 07/03/19 1421)  ondansetron (ZOFRAN) injection 4 mg (4 mg  Intravenous Given 07/03/19 1420)  ketorolac (TORADOL) 30 MG/ML injection 30 mg (30 mg Intravenous Given 07/03/19 1522)  tamsulosin (FLOMAX) capsule 0.4 mg (0.4 mg Oral Given 07/03/19 1522)  sodium chloride 0.9 % bolus 1,000 mL (0 mLs Intravenous Stopped 07/03/19 1637)  oxyCODONE-acetaminophen (PERCOCET/ROXICET) 5-325 MG per tablet 1 tablet (1 tablet Oral Given 07/03/19 1636)    ED Course  I have reviewed the triage vital signs and the nursing notes.  Pertinent labs & imaging results that were available during my care of the patient were reviewed by me and considered in my medical decision making (see chart for details).  Clinical Course as of Jul 03 1643  Wed Jul 03, 2019  1411 Ketones, ur(!): >80 [CG]  1411 Hgb urine dipstick(!): LARGE [CG]  1411 Bacteria, UA(!): MANY [CG]  1411 RBC / HPF: >50 [CG]  1512 WBC: 8.4 [CG]  1512 Creatinine: 1.22 [CG]  1512 IMPRESSION: 1. Mild left-sided hydronephrosis secondary to obstructing 6 mm stone in the proximal left ureter. 2. Bilateral nonobstructing nephrolithiasis. 3. Hepatic steatosis. 4. Scattered colonic diverticula without CT evidence for diverticulitis.  CT Renal Laren Everts [CG]    Clinical Course User Index [CG] Arlean Hopping   MDM Rules/Calculators/A&P                          59 year old male presents with left sided abdominal and flank pain.  History of kidney stones.  Exam reveals left lower quadrant, left lateral mid abdominal and CVA tenderness.  No neuro pulse deficits distally.  No chest pain.  No pulsatile mass.  I have ordered labs including urinalysis.  I ordered antiemetic, Dilaudid, IV fluids.  ER work-up personally reviewed and interpreted by me.  He has greater than 50 RBCs, many bacteria in the urine.  No other signs of UTI.  He is afebrile without white count or dysuria.  Suspect cross-contamination and not truly UTI.  CT confirms left-sided mild hydronephrosis due to a 6 mm stone in the proximal left  ureter.  I have reevaluated patient after medicines and he feels significantly better.  Tolerating p.o. without emesis.  Urinating without issues.  We will discharge with Percocet, Flomax, oral hydration.  He saw urologist in 2018 for first bout of kidney stones.  He has a urine strainer at home.  Return precautions discussed.  Patient and wife are comfortable with plan. Final Clinical Impression(s) / ED Diagnoses Final diagnoses:  Ureteral stone  Hydronephrosis, left    Rx /  DC Orders ED Discharge Orders         Ordered    oxyCODONE-acetaminophen (PERCOCET/ROXICET) 5-325 MG tablet  Every 6 hours PRN     Discontinue  Reprint     07/03/19 1644    tamsulosin (FLOMAX) 0.4 MG CAPS capsule  Daily     Discontinue  Reprint     07/03/19 1644    promethazine (PHENERGAN) 25 MG tablet  Every 6 hours PRN     Discontinue  Reprint     07/03/19 1644           Kinnie Feil, PA-C 07/03/19 1645    Drenda Freeze, MD 07/06/19 2146

## 2019-07-03 NOTE — Discharge Instructions (Signed)
You were seen in the ED for abdominal and flank pain  CT confirms 6 mm stone in the proximal ureter on the left  No evidence of infection or other complicating features  We will treat this with pain medicine and nausea medicine.  Take Percocet every 6 hours for the next 3 days.  Use Phenergan every 6 hours for nausea.  Use Flomax daily to help dilate the ureter.  You can add a 600 mg of ibuprofen every 6-8 hours for mid pain control.  Stay hydrated, you should try to drink at least 2 L of water daily until your urine is clear.  Return to the ED for fever, unbearable pain despite medicines, constant and uncontrollable vomiting, inability to void urine

## 2019-07-03 NOTE — ED Notes (Signed)
ABD pain in LLQ that radiates to left flank. Hx of stones.

## 2019-08-15 DIAGNOSIS — R300 Dysuria: Secondary | ICD-10-CM | POA: Diagnosis not present

## 2019-08-16 DIAGNOSIS — R300 Dysuria: Secondary | ICD-10-CM | POA: Diagnosis not present

## 2019-08-16 DIAGNOSIS — N202 Calculus of kidney with calculus of ureter: Secondary | ICD-10-CM | POA: Diagnosis not present

## 2019-08-20 DIAGNOSIS — E782 Mixed hyperlipidemia: Secondary | ICD-10-CM | POA: Diagnosis not present

## 2019-08-30 DIAGNOSIS — R300 Dysuria: Secondary | ICD-10-CM | POA: Diagnosis not present

## 2019-08-30 DIAGNOSIS — N200 Calculus of kidney: Secondary | ICD-10-CM | POA: Diagnosis not present

## 2019-09-25 ENCOUNTER — Other Ambulatory Visit: Payer: Self-pay | Admitting: *Deleted

## 2019-09-25 DIAGNOSIS — D696 Thrombocytopenia, unspecified: Secondary | ICD-10-CM

## 2019-09-26 ENCOUNTER — Inpatient Hospital Stay: Payer: BC Managed Care – PPO | Admitting: Hematology & Oncology

## 2019-09-26 ENCOUNTER — Inpatient Hospital Stay: Payer: BC Managed Care – PPO

## 2019-10-17 DIAGNOSIS — N200 Calculus of kidney: Secondary | ICD-10-CM | POA: Diagnosis not present

## 2019-11-01 DIAGNOSIS — N200 Calculus of kidney: Secondary | ICD-10-CM | POA: Diagnosis not present

## 2019-11-15 DIAGNOSIS — N200 Calculus of kidney: Secondary | ICD-10-CM | POA: Diagnosis not present

## 2019-11-20 DIAGNOSIS — E1169 Type 2 diabetes mellitus with other specified complication: Secondary | ICD-10-CM | POA: Diagnosis not present

## 2019-11-20 DIAGNOSIS — Z23 Encounter for immunization: Secondary | ICD-10-CM | POA: Diagnosis not present

## 2019-11-20 DIAGNOSIS — D696 Thrombocytopenia, unspecified: Secondary | ICD-10-CM | POA: Diagnosis not present

## 2019-11-20 DIAGNOSIS — E782 Mixed hyperlipidemia: Secondary | ICD-10-CM | POA: Diagnosis not present

## 2019-11-20 DIAGNOSIS — M6580 Other synovitis and tenosynovitis, unspecified site: Secondary | ICD-10-CM | POA: Diagnosis not present

## 2019-11-26 DIAGNOSIS — M722 Plantar fascial fibromatosis: Secondary | ICD-10-CM | POA: Diagnosis not present

## 2020-01-28 ENCOUNTER — Encounter: Payer: Self-pay | Admitting: Family

## 2020-01-28 ENCOUNTER — Inpatient Hospital Stay (HOSPITAL_BASED_OUTPATIENT_CLINIC_OR_DEPARTMENT_OTHER): Payer: BC Managed Care – PPO | Admitting: Family

## 2020-01-28 ENCOUNTER — Telehealth: Payer: Self-pay

## 2020-01-28 ENCOUNTER — Other Ambulatory Visit: Payer: Self-pay

## 2020-01-28 ENCOUNTER — Inpatient Hospital Stay: Payer: BC Managed Care – PPO | Attending: Family

## 2020-01-28 VITALS — BP 128/82 | HR 85 | Temp 98.0°F | Resp 18 | Ht 72.0 in | Wt 213.0 lb

## 2020-01-28 DIAGNOSIS — D693 Immune thrombocytopenic purpura: Secondary | ICD-10-CM | POA: Insufficient documentation

## 2020-01-28 DIAGNOSIS — D696 Thrombocytopenia, unspecified: Secondary | ICD-10-CM

## 2020-01-28 LAB — CBC WITH DIFFERENTIAL (CANCER CENTER ONLY)
Abs Immature Granulocytes: 0.01 10*3/uL (ref 0.00–0.07)
Basophils Absolute: 0 10*3/uL (ref 0.0–0.1)
Basophils Relative: 0 %
Eosinophils Absolute: 0 10*3/uL (ref 0.0–0.5)
Eosinophils Relative: 1 %
HCT: 38.6 % — ABNORMAL LOW (ref 39.0–52.0)
Hemoglobin: 13.5 g/dL (ref 13.0–17.0)
Immature Granulocytes: 0 %
Lymphocytes Relative: 25 %
Lymphs Abs: 1.2 10*3/uL (ref 0.7–4.0)
MCH: 36.5 pg — ABNORMAL HIGH (ref 26.0–34.0)
MCHC: 35 g/dL (ref 30.0–36.0)
MCV: 104.3 fL — ABNORMAL HIGH (ref 80.0–100.0)
Monocytes Absolute: 0.4 10*3/uL (ref 0.1–1.0)
Monocytes Relative: 8 %
Neutro Abs: 3.1 10*3/uL (ref 1.7–7.7)
Neutrophils Relative %: 66 %
Platelet Count: 53 10*3/uL — ABNORMAL LOW (ref 150–400)
RBC: 3.7 MIL/uL — ABNORMAL LOW (ref 4.22–5.81)
RDW: 12.1 % (ref 11.5–15.5)
WBC Count: 4.7 10*3/uL (ref 4.0–10.5)
nRBC: 0 % (ref 0.0–0.2)

## 2020-01-28 LAB — CMP (CANCER CENTER ONLY)
ALT: 33 U/L (ref 0–44)
AST: 20 U/L (ref 15–41)
Albumin: 4.6 g/dL (ref 3.5–5.0)
Alkaline Phosphatase: 55 U/L (ref 38–126)
Anion gap: 7 (ref 5–15)
BUN: 21 mg/dL — ABNORMAL HIGH (ref 6–20)
CO2: 31 mmol/L (ref 22–32)
Calcium: 10.4 mg/dL — ABNORMAL HIGH (ref 8.9–10.3)
Chloride: 101 mmol/L (ref 98–111)
Creatinine: 1.48 mg/dL — ABNORMAL HIGH (ref 0.61–1.24)
GFR, Estimated: 55 mL/min — ABNORMAL LOW (ref >60)
Glucose, Bld: 200 mg/dL — ABNORMAL HIGH (ref 70–99)
Potassium: 4.2 mmol/L (ref 3.5–5.1)
Sodium: 139 mmol/L (ref 135–145)
Total Bilirubin: 0.5 mg/dL (ref 0.3–1.2)
Total Protein: 7.4 g/dL (ref 6.5–8.1)

## 2020-01-28 NOTE — Telephone Encounter (Signed)
appts made and printed for pt-pt also my chart        William Fry

## 2020-01-28 NOTE — Progress Notes (Signed)
Hematology and Oncology Follow Up Visit  Brynn Reznik [id number] [birth year] 60 y.o. 01/28/2020   Principle Diagnosis:  Chronic ITP  Current Therapy:   Observation   Interim History:  Mr. Hashimi is here today for follow-up. He is doing well and has no complaints at this time.  His platelets are 53 and he remains asymptomatic. No episodes of bleeding to report. No abnormal bruising, no petechiae.  He states that he has started eating healthier and has cut down on his juice intake which was his biggest source of sugar. His blood glucose at this time is 200.  He states that he still has kidney stones and has increased from 5 to 9 (5 in one kidney and four in the other). He is currently on a supplement that is supposed to help reduce the production of the stones.  No fever, chills, n/v, cough, rash, dizziness, SOB, chest pain, palpitations, abdominal pain or changes in bowel or bladder habits.  He has had some chest tightness at times when walking a long distance with his wife. He states that he thinks this is related to his lungs and not heart. He states that if it becomes more frequent/continues to occur he will have checked out by his PCP.  No swelling, tenderness, numbness or tingling in his extremities.  No falls or syncope.  He has maintained a good appetite and is staying well hydrated. His weight is stable at 213 lbs.   ECOG Performance Status: 0 - Asymptomatic  Medications:  Allergies as of 01/28/2020   No Known Allergies     Medication List       Accurate as of January 28, 2020  1:40 PM. If you have any questions, ask your nurse or doctor.        acetaminophen 500 MG tablet Commonly known as: TYLENOL Take 500 mg by mouth every 6 (six) hours as needed for mild pain.   aspirin EC 81 MG tablet Take 81 mg by mouth daily.   cholecalciferol 25 MCG (1000 UNIT) tablet Commonly known as: VITAMIN D3 Take 1,000 Units by mouth daily.   ELDERBERRY PO Take 1 tablet by mouth  daily.   Fish Oil 1000 MG Caps Take 1,000 mg by mouth daily.   fluticasone 50 MCG/ACT nasal spray Commonly known as: FLONASE Place 2 sprays into both nostrils daily.   promethazine 25 MG tablet Commonly known as: PHENERGAN Take 1 tablet (25 mg total) by mouth every 6 (six) hours as needed for nausea or vomiting.   vitamin C 1000 MG tablet Take 500 mg by mouth daily.   zinc gluconate 50 MG tablet Take 50 mg by mouth daily.       Allergies: No Known Allergies  Past Medical History, Surgical history, Social history, and Family History were reviewed and updated.  Review of Systems: All other 10 point review of systems is negative.   Physical Exam:  vitals were not taken for this visit.   Wt Readings from Last 3 Encounters:  07/03/19 202 lb (91.6 kg)  06/24/19 204 lb 1.9 oz (92.6 kg)  02/12/19 211 lb (95.7 kg)    Ocular: Sclerae unicteric, pupils equal, round and reactive to light Ear-nose-throat: Oropharynx clear, dentition fair Lymphatic: No cervical or supraclavicular adenopathy Lungs no rales or rhonchi, good excursion bilaterally Heart regular rate and rhythm, no murmur appreciated Abd soft, nontender, positive bowel sounds MSK no focal spinal tenderness, no joint edema Neuro: non-focal, well-oriented, appropriate affect Breasts: Deferred   Lab Results  Component Value Date   WBC 4.7 01/28/2020   HGB 13.5 01/28/2020   HCT 38.6 (L) 01/28/2020   MCV 104.3 (H) 01/28/2020   PLT 53 (L) 01/28/2020   No results found for: FERRITIN, IRON, TIBC, UIBC, IRONPCTSAT Lab Results  Component Value Date   RBC 3.70 (L) 01/28/2020   No results found for: KPAFRELGTCHN, LAMBDASER, KAPLAMBRATIO No results found for: IGGSERUM, IGA, IGMSERUM No results found for: Odetta Pink, SPEI   Chemistry      Component Value Date/Time   NA 137 07/03/2019 1358   K 4.2 07/03/2019 1358   CL 101 07/03/2019 1358   CO2 24 07/03/2019  1358   BUN 16 07/03/2019 1358   CREATININE 1.22 07/03/2019 1358   CREATININE 1.39 (H) 06/24/2019 0815      Component Value Date/Time   CALCIUM 9.5 07/03/2019 1358   ALKPHOS 55 06/24/2019 0815   AST 17 06/24/2019 0815   ALT 22 06/24/2019 0815   BILITOT 0.5 06/24/2019 0815       Impression and Plan: Mr. Godbee is a very pleasant 60 yo caucasian [place] with a a long standing history of mild thrombocytopenia (usually 100-120's). Since having covid last summer (2021), his platelets have been in the 50-60's range.  Today his platelets are 53, Hgb 13.5, WBC count 4.7.  He remains asymptomatic and has no complaints at this time.  Dr. Marin Olp was able to review his blood smear. No evidence of malignancy was noted. We will continue to monitor periodically.  Follow-up in 6 months.  He was encouraged to contact our office with any questions or concerns ( including bruising or abnormal bleeding/petechiae). We can certainly see him sooner if needed.   Laverna Peace, NP 1/25/20221:40 PM

## 2020-01-29 ENCOUNTER — Other Ambulatory Visit: Payer: Self-pay | Admitting: Family

## 2020-01-29 DIAGNOSIS — D696 Thrombocytopenia, unspecified: Secondary | ICD-10-CM

## 2020-01-29 DIAGNOSIS — D693 Immune thrombocytopenic purpura: Secondary | ICD-10-CM

## 2020-01-29 LAB — SAVE SMEAR(SSMR), FOR PROVIDER SLIDE REVIEW

## 2020-04-21 DIAGNOSIS — R82998 Other abnormal findings in urine: Secondary | ICD-10-CM | POA: Diagnosis not present

## 2020-04-21 DIAGNOSIS — N200 Calculus of kidney: Secondary | ICD-10-CM | POA: Diagnosis not present

## 2020-07-28 ENCOUNTER — Inpatient Hospital Stay: Payer: BC Managed Care – PPO | Admitting: Hematology & Oncology

## 2020-07-28 ENCOUNTER — Inpatient Hospital Stay: Payer: BC Managed Care – PPO | Attending: Hematology & Oncology

## 2020-09-09 DIAGNOSIS — Z0000 Encounter for general adult medical examination without abnormal findings: Secondary | ICD-10-CM | POA: Diagnosis not present

## 2020-09-09 DIAGNOSIS — Z125 Encounter for screening for malignant neoplasm of prostate: Secondary | ICD-10-CM | POA: Diagnosis not present

## 2020-09-09 DIAGNOSIS — D693 Immune thrombocytopenic purpura: Secondary | ICD-10-CM | POA: Diagnosis not present

## 2020-09-09 DIAGNOSIS — N200 Calculus of kidney: Secondary | ICD-10-CM | POA: Diagnosis not present

## 2020-09-09 DIAGNOSIS — E782 Mixed hyperlipidemia: Secondary | ICD-10-CM | POA: Diagnosis not present

## 2020-09-09 DIAGNOSIS — E1169 Type 2 diabetes mellitus with other specified complication: Secondary | ICD-10-CM | POA: Diagnosis not present

## 2020-09-30 ENCOUNTER — Ambulatory Visit
Admission: RE | Admit: 2020-09-30 | Discharge: 2020-09-30 | Disposition: A | Discharge status: Home / Self Care | Payer: BC Managed Care – PPO | Source: Ambulatory Visit | Attending: Family Medicine | Admitting: Family Medicine

## 2020-09-30 ENCOUNTER — Other Ambulatory Visit: Payer: Self-pay | Admitting: Family Medicine

## 2020-09-30 DIAGNOSIS — M545 Low back pain, unspecified: Secondary | ICD-10-CM | POA: Diagnosis not present

## 2020-09-30 DIAGNOSIS — M47812 Spondylosis without myelopathy or radiculopathy, cervical region: Secondary | ICD-10-CM | POA: Diagnosis not present

## 2020-09-30 DIAGNOSIS — R52 Pain, unspecified: Secondary | ICD-10-CM

## 2020-09-30 DIAGNOSIS — M47814 Spondylosis without myelopathy or radiculopathy, thoracic region: Secondary | ICD-10-CM | POA: Diagnosis not present

## 2020-09-30 DIAGNOSIS — E1169 Type 2 diabetes mellitus with other specified complication: Secondary | ICD-10-CM | POA: Diagnosis not present

## 2021-01-06 DIAGNOSIS — Z1211 Encounter for screening for malignant neoplasm of colon: Secondary | ICD-10-CM | POA: Diagnosis not present

## 2021-01-06 DIAGNOSIS — D693 Immune thrombocytopenic purpura: Secondary | ICD-10-CM | POA: Diagnosis not present

## 2021-01-06 DIAGNOSIS — E1165 Type 2 diabetes mellitus with hyperglycemia: Secondary | ICD-10-CM | POA: Diagnosis not present

## 2021-03-02 DIAGNOSIS — D124 Benign neoplasm of descending colon: Secondary | ICD-10-CM | POA: Diagnosis not present

## 2021-03-02 DIAGNOSIS — D123 Benign neoplasm of transverse colon: Secondary | ICD-10-CM | POA: Diagnosis not present

## 2021-03-02 DIAGNOSIS — K5730 Diverticulosis of large intestine without perforation or abscess without bleeding: Secondary | ICD-10-CM | POA: Diagnosis not present

## 2021-03-02 DIAGNOSIS — K635 Polyp of colon: Secondary | ICD-10-CM | POA: Diagnosis not present

## 2021-03-02 DIAGNOSIS — Z1211 Encounter for screening for malignant neoplasm of colon: Secondary | ICD-10-CM | POA: Diagnosis not present

## 2021-04-12 DIAGNOSIS — M79672 Pain in left foot: Secondary | ICD-10-CM | POA: Diagnosis not present

## 2021-04-14 DIAGNOSIS — M79672 Pain in left foot: Secondary | ICD-10-CM | POA: Diagnosis not present

## 2021-04-14 DIAGNOSIS — M79605 Pain in left leg: Secondary | ICD-10-CM | POA: Diagnosis not present

## 2021-04-16 ENCOUNTER — Other Ambulatory Visit: Payer: Self-pay | Admitting: Sports Medicine

## 2021-04-16 ENCOUNTER — Ambulatory Visit
Admission: RE | Admit: 2021-04-16 | Discharge: 2021-04-16 | Disposition: A | Discharge status: Home / Self Care | Payer: BC Managed Care – PPO | Source: Ambulatory Visit | Attending: Sports Medicine | Admitting: Sports Medicine

## 2021-04-16 DIAGNOSIS — M7732 Calcaneal spur, left foot: Secondary | ICD-10-CM | POA: Diagnosis not present

## 2021-04-16 DIAGNOSIS — M79672 Pain in left foot: Secondary | ICD-10-CM

## 2021-04-16 DIAGNOSIS — M5136 Other intervertebral disc degeneration, lumbar region: Secondary | ICD-10-CM | POA: Diagnosis not present

## 2021-04-16 DIAGNOSIS — M545 Low back pain, unspecified: Secondary | ICD-10-CM

## 2021-04-16 DIAGNOSIS — M19072 Primary osteoarthritis, left ankle and foot: Secondary | ICD-10-CM | POA: Diagnosis not present

## 2021-04-23 DIAGNOSIS — M2569 Stiffness of other specified joint, not elsewhere classified: Secondary | ICD-10-CM | POA: Diagnosis not present

## 2021-04-23 DIAGNOSIS — M545 Low back pain, unspecified: Secondary | ICD-10-CM | POA: Diagnosis not present

## 2021-04-23 DIAGNOSIS — M79605 Pain in left leg: Secondary | ICD-10-CM | POA: Diagnosis not present

## 2021-04-23 DIAGNOSIS — M6281 Muscle weakness (generalized): Secondary | ICD-10-CM | POA: Diagnosis not present

## 2021-04-27 DIAGNOSIS — M545 Low back pain, unspecified: Secondary | ICD-10-CM | POA: Diagnosis not present

## 2021-04-27 DIAGNOSIS — M79605 Pain in left leg: Secondary | ICD-10-CM | POA: Diagnosis not present

## 2021-04-27 DIAGNOSIS — M6281 Muscle weakness (generalized): Secondary | ICD-10-CM | POA: Diagnosis not present

## 2021-04-27 DIAGNOSIS — M2569 Stiffness of other specified joint, not elsewhere classified: Secondary | ICD-10-CM | POA: Diagnosis not present

## 2021-04-30 DIAGNOSIS — M545 Low back pain, unspecified: Secondary | ICD-10-CM | POA: Diagnosis not present

## 2021-04-30 DIAGNOSIS — M79605 Pain in left leg: Secondary | ICD-10-CM | POA: Diagnosis not present

## 2021-04-30 DIAGNOSIS — M2569 Stiffness of other specified joint, not elsewhere classified: Secondary | ICD-10-CM | POA: Diagnosis not present

## 2021-04-30 DIAGNOSIS — M6281 Muscle weakness (generalized): Secondary | ICD-10-CM | POA: Diagnosis not present

## 2021-05-04 DIAGNOSIS — M2569 Stiffness of other specified joint, not elsewhere classified: Secondary | ICD-10-CM | POA: Diagnosis not present

## 2021-05-04 DIAGNOSIS — M6281 Muscle weakness (generalized): Secondary | ICD-10-CM | POA: Diagnosis not present

## 2021-05-04 DIAGNOSIS — M79605 Pain in left leg: Secondary | ICD-10-CM | POA: Diagnosis not present

## 2021-05-04 DIAGNOSIS — M545 Low back pain, unspecified: Secondary | ICD-10-CM | POA: Diagnosis not present

## 2021-05-07 DIAGNOSIS — M79605 Pain in left leg: Secondary | ICD-10-CM | POA: Diagnosis not present

## 2021-05-07 DIAGNOSIS — M6281 Muscle weakness (generalized): Secondary | ICD-10-CM | POA: Diagnosis not present

## 2021-05-07 DIAGNOSIS — M2569 Stiffness of other specified joint, not elsewhere classified: Secondary | ICD-10-CM | POA: Diagnosis not present

## 2021-05-07 DIAGNOSIS — M545 Low back pain, unspecified: Secondary | ICD-10-CM | POA: Diagnosis not present

## 2021-05-11 DIAGNOSIS — M2569 Stiffness of other specified joint, not elsewhere classified: Secondary | ICD-10-CM | POA: Diagnosis not present

## 2021-05-11 DIAGNOSIS — M6281 Muscle weakness (generalized): Secondary | ICD-10-CM | POA: Diagnosis not present

## 2021-05-11 DIAGNOSIS — M545 Low back pain, unspecified: Secondary | ICD-10-CM | POA: Diagnosis not present

## 2021-05-11 DIAGNOSIS — M79605 Pain in left leg: Secondary | ICD-10-CM | POA: Diagnosis not present

## 2021-05-13 DIAGNOSIS — M79672 Pain in left foot: Secondary | ICD-10-CM | POA: Diagnosis not present

## 2021-05-13 DIAGNOSIS — M5442 Lumbago with sciatica, left side: Secondary | ICD-10-CM | POA: Diagnosis not present

## 2021-05-14 DIAGNOSIS — M79605 Pain in left leg: Secondary | ICD-10-CM | POA: Diagnosis not present

## 2021-05-14 DIAGNOSIS — M545 Low back pain, unspecified: Secondary | ICD-10-CM | POA: Diagnosis not present

## 2021-05-14 DIAGNOSIS — M2569 Stiffness of other specified joint, not elsewhere classified: Secondary | ICD-10-CM | POA: Diagnosis not present

## 2021-05-14 DIAGNOSIS — M6281 Muscle weakness (generalized): Secondary | ICD-10-CM | POA: Diagnosis not present

## 2021-05-17 ENCOUNTER — Other Ambulatory Visit: Payer: Self-pay | Admitting: Sports Medicine

## 2021-05-17 DIAGNOSIS — E782 Mixed hyperlipidemia: Secondary | ICD-10-CM | POA: Diagnosis not present

## 2021-05-17 DIAGNOSIS — M5442 Lumbago with sciatica, left side: Secondary | ICD-10-CM

## 2021-05-17 DIAGNOSIS — D693 Immune thrombocytopenic purpura: Secondary | ICD-10-CM | POA: Diagnosis not present

## 2021-05-17 DIAGNOSIS — E1169 Type 2 diabetes mellitus with other specified complication: Secondary | ICD-10-CM | POA: Diagnosis not present

## 2021-05-18 DIAGNOSIS — M6281 Muscle weakness (generalized): Secondary | ICD-10-CM | POA: Diagnosis not present

## 2021-05-18 DIAGNOSIS — M79605 Pain in left leg: Secondary | ICD-10-CM | POA: Diagnosis not present

## 2021-05-18 DIAGNOSIS — M2569 Stiffness of other specified joint, not elsewhere classified: Secondary | ICD-10-CM | POA: Diagnosis not present

## 2021-05-18 DIAGNOSIS — M545 Low back pain, unspecified: Secondary | ICD-10-CM | POA: Diagnosis not present

## 2021-05-19 ENCOUNTER — Ambulatory Visit
Admission: RE | Admit: 2021-05-19 | Discharge: 2021-05-19 | Disposition: A | Discharge status: Home / Self Care | Payer: BC Managed Care – PPO | Source: Ambulatory Visit | Attending: Sports Medicine | Admitting: Sports Medicine

## 2021-05-19 DIAGNOSIS — M545 Low back pain, unspecified: Secondary | ICD-10-CM | POA: Diagnosis not present

## 2021-05-19 DIAGNOSIS — M5442 Lumbago with sciatica, left side: Secondary | ICD-10-CM

## 2021-05-21 DIAGNOSIS — M79605 Pain in left leg: Secondary | ICD-10-CM | POA: Diagnosis not present

## 2021-05-21 DIAGNOSIS — M2569 Stiffness of other specified joint, not elsewhere classified: Secondary | ICD-10-CM | POA: Diagnosis not present

## 2021-05-21 DIAGNOSIS — M545 Low back pain, unspecified: Secondary | ICD-10-CM | POA: Diagnosis not present

## 2021-05-21 DIAGNOSIS — M6281 Muscle weakness (generalized): Secondary | ICD-10-CM | POA: Diagnosis not present

## 2021-05-25 DIAGNOSIS — M6281 Muscle weakness (generalized): Secondary | ICD-10-CM | POA: Diagnosis not present

## 2021-05-25 DIAGNOSIS — M2569 Stiffness of other specified joint, not elsewhere classified: Secondary | ICD-10-CM | POA: Diagnosis not present

## 2021-05-25 DIAGNOSIS — M545 Low back pain, unspecified: Secondary | ICD-10-CM | POA: Diagnosis not present

## 2021-05-25 DIAGNOSIS — M79605 Pain in left leg: Secondary | ICD-10-CM | POA: Diagnosis not present

## 2021-05-27 DIAGNOSIS — M79605 Pain in left leg: Secondary | ICD-10-CM | POA: Diagnosis not present

## 2021-05-27 DIAGNOSIS — M545 Low back pain, unspecified: Secondary | ICD-10-CM | POA: Diagnosis not present

## 2021-05-27 DIAGNOSIS — M6281 Muscle weakness (generalized): Secondary | ICD-10-CM | POA: Diagnosis not present

## 2021-05-27 DIAGNOSIS — M2569 Stiffness of other specified joint, not elsewhere classified: Secondary | ICD-10-CM | POA: Diagnosis not present

## 2021-05-28 ENCOUNTER — Emergency Department (HOSPITAL_COMMUNITY)
Admission: EM | Admit: 2021-05-28 | Discharge: 2021-05-28 | Disposition: A | Discharge status: Home / Self Care | Payer: BC Managed Care – PPO | Attending: Emergency Medicine | Admitting: Emergency Medicine

## 2021-05-28 DIAGNOSIS — M5442 Lumbago with sciatica, left side: Secondary | ICD-10-CM | POA: Insufficient documentation

## 2021-05-28 DIAGNOSIS — E119 Type 2 diabetes mellitus without complications: Secondary | ICD-10-CM | POA: Insufficient documentation

## 2021-05-28 DIAGNOSIS — Z7982 Long term (current) use of aspirin: Secondary | ICD-10-CM | POA: Insufficient documentation

## 2021-05-28 DIAGNOSIS — M549 Dorsalgia, unspecified: Secondary | ICD-10-CM | POA: Diagnosis not present

## 2021-05-28 MED ORDER — OXYCODONE-ACETAMINOPHEN 5-325 MG PO TABS: 1.0000 | ORAL_TABLET | Freq: Four times a day (QID) | ORAL | 0 refills | Status: AC | PRN

## 2021-05-28 MED ORDER — HYDROMORPHONE HCL 1 MG/ML IJ SOLN
0.5000 mg | Freq: Once | INTRAMUSCULAR | Status: AC
  Administered 2021-05-28: 0.5 mg via INTRAMUSCULAR
  Filled 2021-05-28: qty 1

## 2021-05-28 MED ORDER — HYDROMORPHONE HCL 1 MG/ML IJ SOLN
0.5000 mg | Freq: Once | INTRAMUSCULAR | Status: AC
  Administered 2021-05-28: 0.5 mg via INTRAMUSCULAR
  Filled 2021-05-28 (×2): qty 1

## 2021-05-28 MED ORDER — PREDNISONE 20 MG PO TABS: ORAL_TABLET | ORAL | 0 refills | Status: AC

## 2021-05-28 MED ORDER — KETOROLAC TROMETHAMINE 15 MG/ML IJ SOLN
15.0000 mg | Freq: Once | INTRAMUSCULAR | Status: AC
  Administered 2021-05-28: 15 mg via INTRAMUSCULAR
  Filled 2021-05-28: qty 1

## 2021-05-28 MED ORDER — DIAZEPAM 5 MG PO TABS
5.0000 mg | ORAL_TABLET | Freq: Once | ORAL | Status: AC
  Administered 2021-05-28: 5 mg via ORAL
  Filled 2021-05-28: qty 1

## 2021-05-28 MED ORDER — PREDNISONE 20 MG PO TABS
60.0000 mg | ORAL_TABLET | Freq: Once | ORAL | Status: AC
  Administered 2021-05-28: 60 mg via ORAL
  Filled 2021-05-28: qty 3

## 2021-05-28 MED ORDER — ONDANSETRON 4 MG PO TBDP
4.0000 mg | ORAL_TABLET | Freq: Once | ORAL | Status: AC
  Administered 2021-05-28: 4 mg via ORAL
  Filled 2021-05-28: qty 1

## 2021-05-28 NOTE — ED Triage Notes (Signed)
PT BIB [facility] from home for acute/chronic back pain.  Per EMS pt has Chronic back pain for which he has been receiving PT.  He flew to [place] on Monday and began having acutely worse back pain since returning on Tuesday.  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 Pt also has DM and has had nothing to eat today.   EMS VS 125/71 96%% 55 16 CBG 170.

## 2021-05-28 NOTE — ED Provider Notes (Signed)
[facility] EMERGENCY DEPARTMENT Provider Note   CSN: [id number] Arrival date & time: 05/28/21  1334     History  No chief complaint on file.   William Fry is a 61 y.o. male.  HPI Patient is a 61 year old male who presents to the emergency department due to low back pain.  Patient initially seen by previous EDP and was discharged to go home.  Nursing staff notified me that when patient began to ambulate his pain worsened significantly and states that he does not feel comfortable going home at this time.  Still reports low back pain and that his radiating down the left posterior leg to the toes.  No numbness, weakness, bowel/bladder incontinence, saddle anesthesia.  He had an MRI of his lumbar spine which showed a herniated disc at the level of L4-5 several weeks ago.  He notes that his symptoms began worsening again over the past week after sitting for prolonged period of time on an airplane.  He was previously prescribed tramadol which she had been taking with little relief.    Home Medications Prior to Admission medications   Medication Sig Start Date End Date Taking? Authorizing Provider  oxyCODONE-acetaminophen (PERCOCET) 5-325 MG tablet Take 1 tablet by mouth every 6 (six) hours as needed for moderate pain or severe pain. 05/28/21  Yes Domenic Moras, PA-C  predniSONE (DELTASONE) 20 MG tablet 3 tabs po day one, then 2 tabs daily x 4 days 05/28/21  Yes Domenic Moras, PA-C  acetaminophen (TYLENOL) 500 MG tablet Take 500 mg by mouth every 6 (six) hours as needed for mild pain.    [provider]  Ascorbic Acid (VITAMIN C) 1000 MG tablet Take 500 mg by mouth daily.    [provider]  aspirin EC 81 MG tablet Take 81 mg by mouth daily.    [provider]  ELDERBERRY PO Take 1 tablet by mouth daily.    [provider]  fluticasone (FLONASE) 50 MCG/ACT nasal spray Place 2 sprays into both nostrils daily.     [provider]  Omega-3  Fatty Acids (FISH OIL) 1000 MG CAPS Take 1,000 mg by mouth daily.     [provider]      Allergies    Patient has no known allergies.    Review of Systems   Review of Systems  All other systems reviewed and are negative. Ten systems reviewed and are negative for acute change, except as noted in the HPI.   Physical Exam Updated Vital Signs BP 130/84   Pulse (!) 55   Temp 98 F (36.7 C)   Resp 16   SpO2 91%  Physical Exam Vitals and nursing note reviewed.  Constitutional:      General: He is not in acute distress.    Appearance: He is well-developed.  HENT:     Head: Normocephalic and atraumatic.     Right Ear: External ear normal.     Left Ear: External ear normal.  Eyes:     General: No scleral icterus.       Right eye: No discharge.        Left eye: No discharge.     Conjunctiva/sclera: Conjunctivae normal.  Neck:     Trachea: No tracheal deviation.  Cardiovascular:     Rate and Rhythm: Normal rate.  Pulmonary:     Effort: Pulmonary effort is normal. No respiratory distress.     Breath sounds: No stridor.  Abdominal:  General: There is no distension.  Musculoskeletal:        General: Tenderness present. No swelling or deformity.     Cervical back: Neck supple.     Comments: Moderate TTP noted to the midline lumbar spine with additional moderate TTP noted to the left lateral lumbar paraspinal musculature.  Positive straight leg raise on the left.  Distal sensation intact in the left leg.  Strength is 5/5 in the left leg.  Palpable pedal pulse.  Skin:    General: Skin is warm and dry.     Findings: No rash.  Neurological:     General: No focal deficit present.     Mental Status: He is alert and oriented to person, place, and time.     Cranial Nerves: Cranial nerve deficit: no gross deficits.   ED Results / Procedures / Treatments   Labs (all labs ordered are listed, but only abnormal results are displayed) Labs Reviewed - No data to  display  EKG None  Radiology No results found.  Procedures Procedures   Medications Ordered in ED Medications  HYDROmorphone (DILAUDID) injection 0.5 mg (has no administration in time range)  ketorolac (TORADOL) 15 MG/ML injection 15 mg (has no administration in time range)  HYDROmorphone (DILAUDID) injection 0.5 mg (0.5 mg Intramuscular Given 05/28/21 1423)  diazepam (VALIUM) tablet 5 mg (5 mg Oral Given 05/28/21 1423)  predniSONE (DELTASONE) tablet 60 mg (60 mg Oral Given 05/28/21 1423)   ED Course/ Medical Decision Making/ A&P                           Medical Decision Making Risk Prescription drug management.  Patient is a 61 year old male who presents to the emergency department due to low back pain.  Patient states that this been ongoing for the past few weeks but began worsening about 1 week ago after sitting on a plane for a long period of time.  Patient was initially evaluated earlier today and discharged but after ambulating his pain significantly worsened.  He had an MRI of the lumbar spine on May 17 of this year with findings as noted below:  IMPRESSION: 1. L4-5 left paracentral herniation impinging on the left L5 nerve root. 2. Noncompressive degenerative changes are described above.  On my reassessment patient has moderate tenderness along the midline lumbar spine as well as left lumbar paraspinal musculature.  Positive straight leg raise on the left.  Neurovascularly intact in the bilateral lower extremities.  Patient afebrile, nontachycardic, and nontoxic-appearing.  Does not appear consistent with SEA or cauda equina at this time.  Patient's symptoms initially were treated with 5 mg of Valium, 0.5 mg of Dilaudid, as well as 60 mg of prednisone.  We will treat with additional 0.5 mg of IM Dilaudid as well as 15 mg of IM Toradol and reassess.  On reassessment patient states his pain has improved significantly.  He is now able to get up and transfer to the wheelchair  without assistance.  He appears stable for discharge at this time and he is agreeable.  His family is going to drive him home.  Discussed return precautions.  His questions were answered and he was amicable at the time of discharge. Final Clinical Impression(s) / ED Diagnoses Final diagnoses:  Acute back pain with sciatica, left   Rx / DC Orders ED Discharge Orders          Ordered    predniSONE (DELTASONE) 20 MG tablet  05/28/21 1516    oxyCODONE-acetaminophen (PERCOCET) 5-325 MG tablet  Every 6 hours PRN        05/28/21 1516              Rayna Sexton, PA-C 05/28/21 1831    Sherwood Gambler, MD 05/30/21 1606

## 2021-05-28 NOTE — ED Notes (Signed)
DC instructions reviewed with pt. PT verbalized understanding. Pt DC °

## 2021-05-28 NOTE — ED Provider Notes (Signed)
[facility] EMERGENCY DEPARTMENT Provider Note   CSN: [id number] Arrival date & time: 05/28/21  1334     History  No chief complaint on file.   William Fry is a 61 y.o. male.  The history is provided by the patient, the spouse and medical records. No language interpreter was used.   61 year old male seen with history of chronic back pain brought here via EMS from home for evaluation of back pain.  Patient reports several weeks ago he had an MRI of his lumbar spine that showed herniated disc at the level of L4-L5.  He is currently receiving physical therapy and states he was doing better however several days ago he had to sit in a plane to fly up [place] and flew back.  He reports after sitting for prolonged period of time he noted increasing pain to his lower back.  Pain is sharp shooting down his left leg and for the past few days he is having trouble ambulating secondary to the pain.  He does not endorse any fever or chills no dysuria bowel bladder incontinence or saddle anesthesia.  He did try taking tramadol that was previously prescribed for him without adequate improvement thus prompting this ER visit.  Home Medications Prior to Admission medications   Medication Sig Start Date End Date Taking? Authorizing Provider  acetaminophen (TYLENOL) 500 MG tablet Take 500 mg by mouth every 6 (six) hours as needed for mild pain.    [provider]  Ascorbic Acid (VITAMIN C) 1000 MG tablet Take 500 mg by mouth daily.    [provider]  aspirin EC 81 MG tablet Take 81 mg by mouth daily.    [provider]  ELDERBERRY PO Take 1 tablet by mouth daily.    [provider]  fluticasone (FLONASE) 50 MCG/ACT nasal spray Place 2 sprays into both nostrils daily.     [provider]  Omega-3 Fatty Acids (FISH OIL) 1000 MG CAPS Take 1,000 mg by mouth daily.     [provider]      Allergies    Patient has no known allergies.     Review of Systems   Review of Systems  All other systems reviewed and are negative.  Physical Exam Updated Vital Signs BP 130/84   Pulse (!) 55   Temp 98 F (36.7 C)   Resp 16   SpO2 91%  Physical Exam Vitals and nursing note reviewed.  Constitutional:      General: He is not in acute distress.    Appearance: He is well-developed.  HENT:     Head: Atraumatic.  Eyes:     Conjunctiva/sclera: Conjunctivae normal.  Abdominal:     Palpations: Abdomen is soft.     Tenderness: There is no abdominal tenderness.  Musculoskeletal:        General: Tenderness (Tenderness noted to lumbar spine on palpation with positive left straight leg raise.  No overlying skin changes.) present.     Cervical back: Neck supple.  Skin:    Capillary Refill: Capillary refill takes less than 2 seconds.     Findings: No rash.  Neurological:     Mental Status: He is alert and oriented to person, place, and time.    ED Results / Procedures / Treatments   Labs (all labs ordered are listed, but only abnormal results are displayed) Labs Reviewed - No data to display  EKG None  Radiology No results found.  Procedures Procedures  Medications Ordered in ED Medications - No data to display  ED Course/ Medical Decision Making/ A&P                           Medical Decision Making  BP 130/84   Pulse (!) 55   Temp 98 F (36.7 C)   Resp 16   SpO2 91%   2:16 PM This is a 61 year old male with significant history of chronic back pain related to L4-L5 disc herniation who is currently receiving physical therapy.  He is here with worsening radicular left leg painFor the past few days.  Having difficulty ambulating secondary to pain.  Pain worsening with movement.  No red flags.He does have history of diabetes.  He denies any recent injury.   On exam he has reproducible lumbar spine tenderness with positive left straight leg raise suggestive of left-sided sciatica.  No evidence to suggest cauda  equina syndrome.  I have considered advanced imaging and labs and UA however I have low suspicion for infectious etiology, fracture dislocation, or other pathology aside from sciatica.  Plan to provide symptomatic treatment.  Will prescribe prednisone however patient will need to monitor his blood sugar closely while being on steroids as it can increase his CBG.  Patient voiced understanding and agrees with plan.  3:17 PM Patient did report improvement of his symptoms after receiving treatment in the ED.  Able to ambulate with assist.        Final Clinical Impression(s) / ED Diagnoses Final diagnoses:  Acute back pain with sciatica, left    Rx / DC Orders ED Discharge Orders          Ordered    predniSONE (DELTASONE) 20 MG tablet        05/28/21 1516    oxyCODONE-acetaminophen (PERCOCET) 5-325 MG tablet  Every 6 hours PRN        05/28/21 1516              Domenic Moras, PA-C 05/28/21 1518    Horton, Alvin Critchley, DO 05/29/21 2118

## 2021-05-28 NOTE — Discharge Instructions (Addendum)
You have symptoms consistent with sciatica which is a pinched nerve to lumbar spine.  You may take prednisone as prescribed as well as Percocet for pain control.  Please be aware prednisone will likely increase your blood sugar therefore you will need to monitor and manage appropriately.  Follow-up closely with your doctor for further care, return if you have any concern.

## 2021-06-02 DIAGNOSIS — M5416 Radiculopathy, lumbar region: Secondary | ICD-10-CM | POA: Diagnosis not present

## 2021-06-03 DIAGNOSIS — M5116 Intervertebral disc disorders with radiculopathy, lumbar region: Secondary | ICD-10-CM | POA: Diagnosis not present

## 2021-06-03 DIAGNOSIS — M5126 Other intervertebral disc displacement, lumbar region: Secondary | ICD-10-CM | POA: Diagnosis not present

## 2021-09-14 DIAGNOSIS — D693 Immune thrombocytopenic purpura: Secondary | ICD-10-CM | POA: Diagnosis not present

## 2021-09-14 DIAGNOSIS — E782 Mixed hyperlipidemia: Secondary | ICD-10-CM | POA: Diagnosis not present

## 2021-09-14 DIAGNOSIS — E1169 Type 2 diabetes mellitus with other specified complication: Secondary | ICD-10-CM | POA: Diagnosis not present

## 2021-09-14 DIAGNOSIS — Z125 Encounter for screening for malignant neoplasm of prostate: Secondary | ICD-10-CM | POA: Diagnosis not present

## 2021-09-14 DIAGNOSIS — Z23 Encounter for immunization: Secondary | ICD-10-CM | POA: Diagnosis not present

## 2021-09-14 DIAGNOSIS — Z0000 Encounter for general adult medical examination without abnormal findings: Secondary | ICD-10-CM | POA: Diagnosis not present

## 2022-01-21 ENCOUNTER — Other Ambulatory Visit (HOSPITAL_COMMUNITY): Payer: Self-pay

## 2022-01-21 MED ORDER — RYBELSUS 7 MG PO TABS
7.0000 mg | ORAL_TABLET | Freq: Every day | ORAL | 2 refills | Status: AC
  Filled 2022-01-21: qty 30, 30d supply, fill #0
  Filled 2022-02-26 – 2022-02-28 (×2): qty 60, 60d supply, fill #1

## 2022-01-21 MED ORDER — PITAVASTATIN CALCIUM 2 MG PO TABS
1.0000 mg | ORAL_TABLET | Freq: Every day | ORAL | 1 refills | Status: AC
  Filled 2022-01-21 – 2022-02-03 (×4): qty 45, 90d supply, fill #0
  Filled 2022-02-26 – 2022-10-12 (×2): qty 45, 90d supply, fill #1

## 2022-01-24 ENCOUNTER — Other Ambulatory Visit (HOSPITAL_COMMUNITY): Payer: Self-pay

## 2022-01-25 ENCOUNTER — Other Ambulatory Visit (HOSPITAL_COMMUNITY): Payer: Self-pay

## 2022-02-03 ENCOUNTER — Other Ambulatory Visit (HOSPITAL_COMMUNITY): Payer: Self-pay

## 2022-02-28 ENCOUNTER — Other Ambulatory Visit (HOSPITAL_COMMUNITY): Payer: Self-pay

## 2022-03-16 ENCOUNTER — Other Ambulatory Visit (HOSPITAL_COMMUNITY): Payer: Self-pay

## 2022-03-16 MED ORDER — PITAVASTATIN CALCIUM 2 MG PO TABS
2.0000 mg | ORAL_TABLET | Freq: Every day | ORAL | 3 refills | Status: AC
  Filled 2022-03-16: qty 90, 90d supply, fill #0

## 2022-03-17 ENCOUNTER — Other Ambulatory Visit (HOSPITAL_COMMUNITY): Payer: Self-pay

## 2022-05-29 IMAGING — CT CT RENAL STONE PROTOCOL
2 of 4 series · 16 of 46 positions shown, 18 images · non-contrast
Comparison: January 27, 2017.

CLINICAL DATA: Left flank pain with gross hematuria.

EXAM:
CT ABDOMEN AND PELVIS WITHOUT CONTRAST
TECHNIQUE: Multidetector CT imaging of the abdomen and pelvis was performed
following the standard protocol without IV contrast.

[Series 2: axial st · axial · 0.81mm/px · z∈[+8,+508]mm · 13 of 110 slices shown, 15 images]
[im 5/110  soft-tissue]
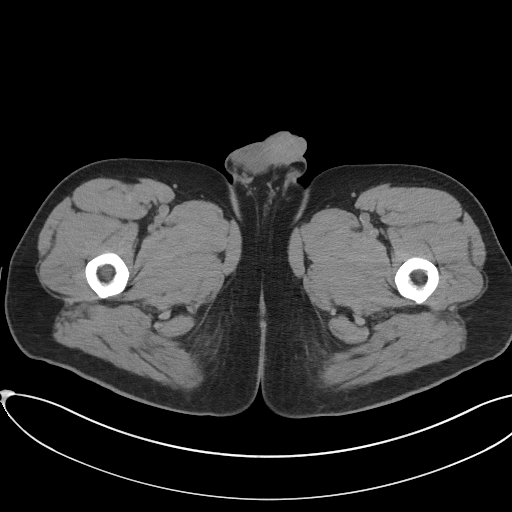
[im 5/110  bone]
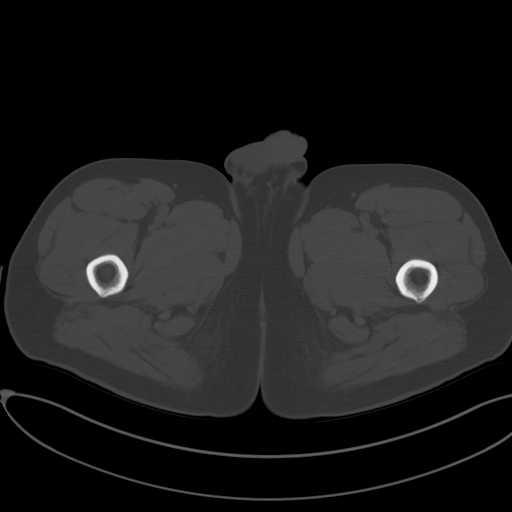
[im 14/110  soft-tissue]
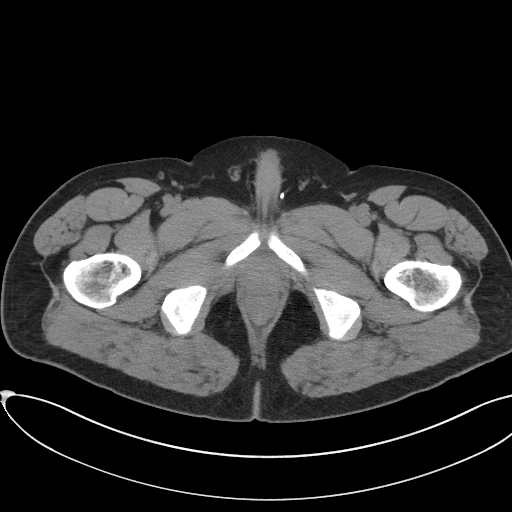
[im 22/110  soft-tissue]
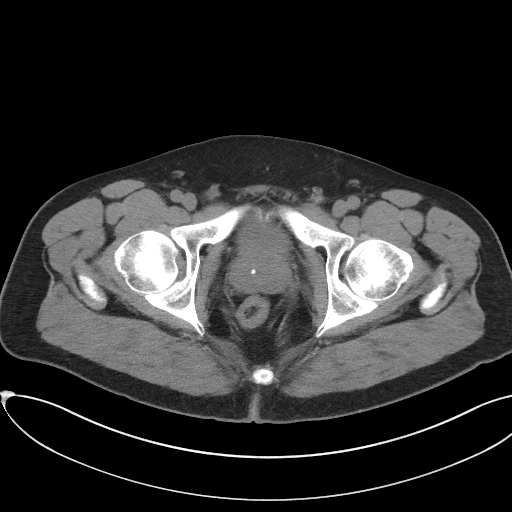
[im 31/110  soft-tissue]
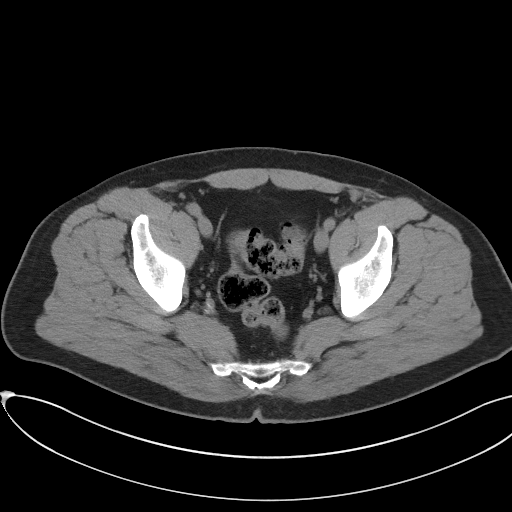
[im 40/110  soft-tissue]
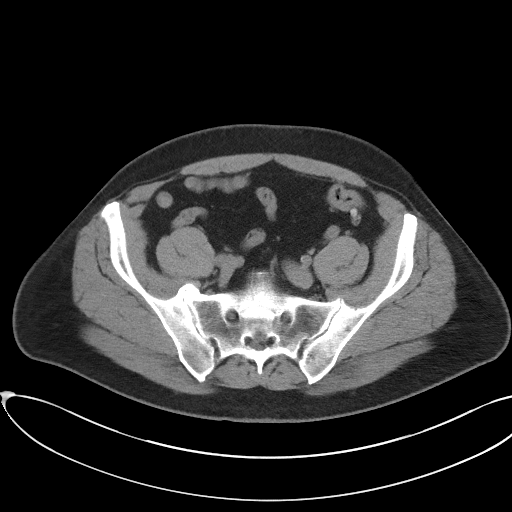
[im 48/110  soft-tissue]
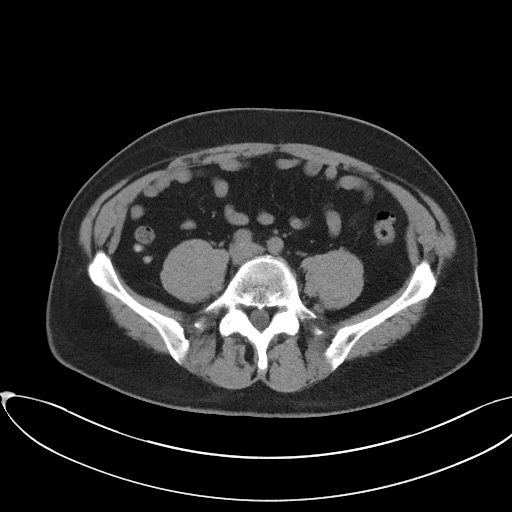
[im 57/110  soft-tissue]
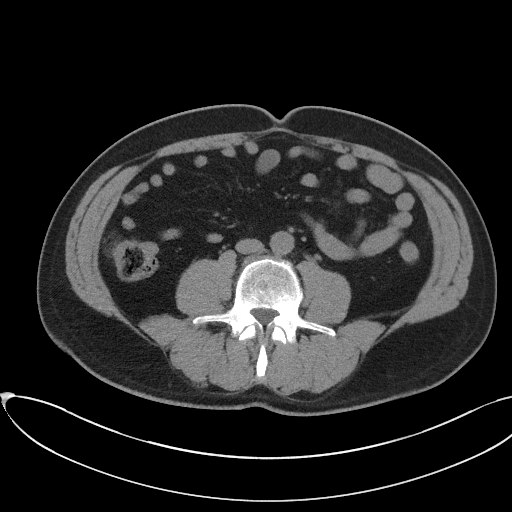
[im 62/110  soft-tissue]
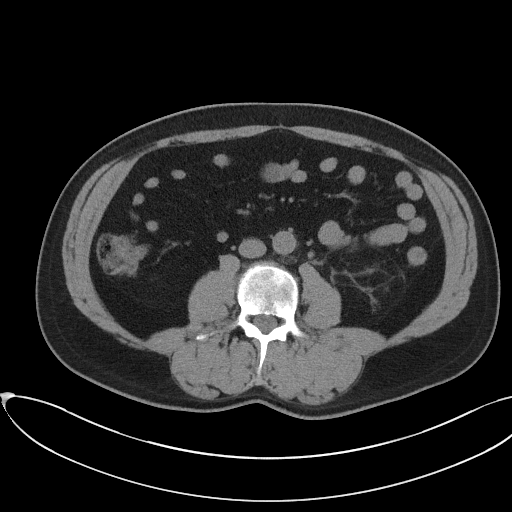
[im 70/110  soft-tissue]
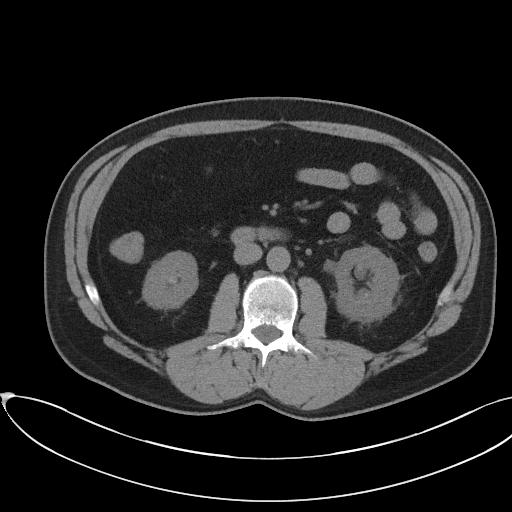
[im 70/110  bone]
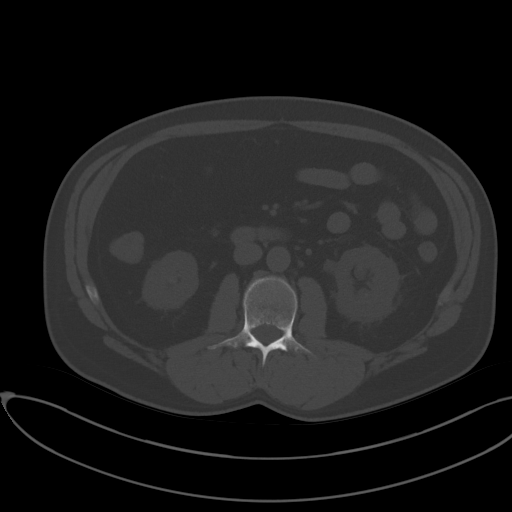
[im 79/110  soft-tissue]
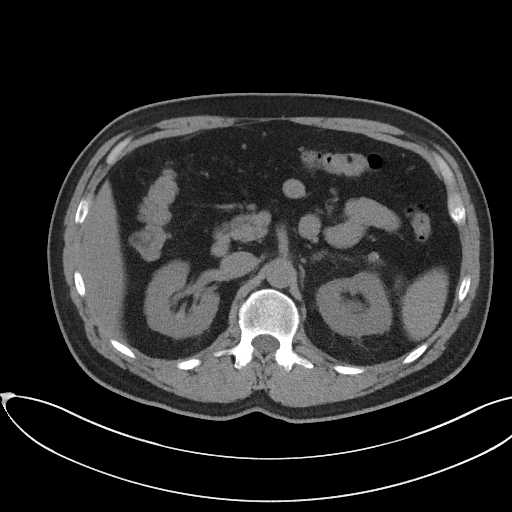
[im 88/110  soft-tissue]
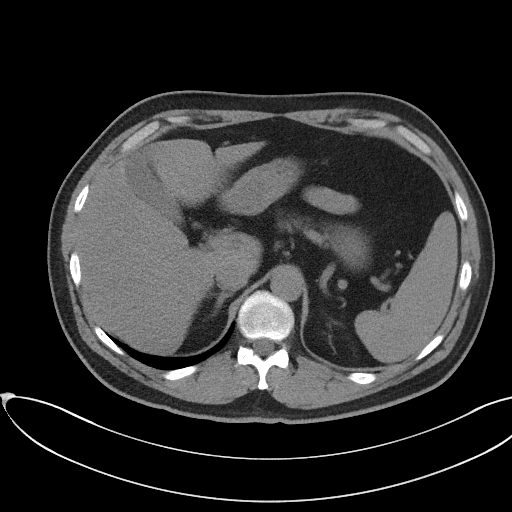
[im 96/110  soft-tissue]
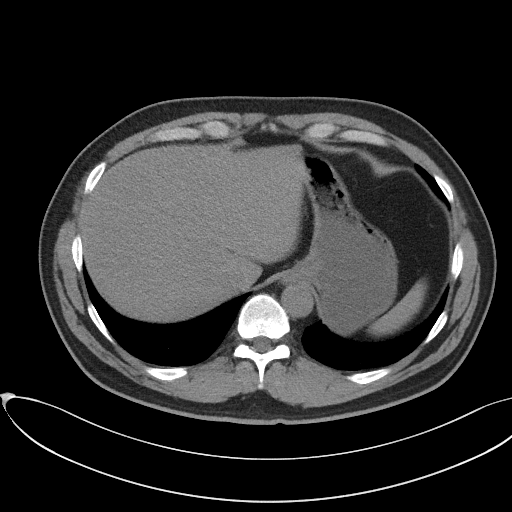
[im 105/110  soft-tissue]
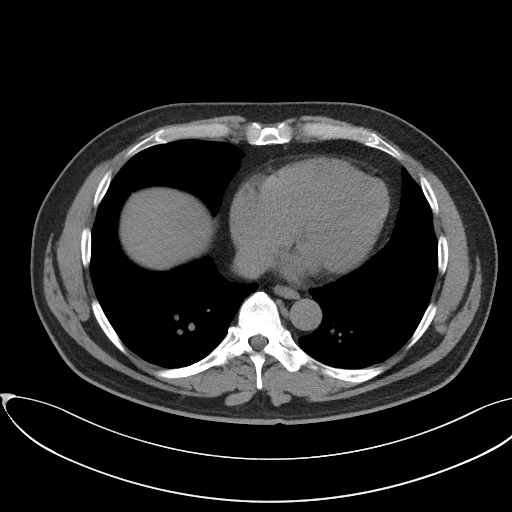

[Series 5: coronal st · coronal · 0.79mm/px · 3 of 97 slices shown]
[im 33/97  soft-tissue]
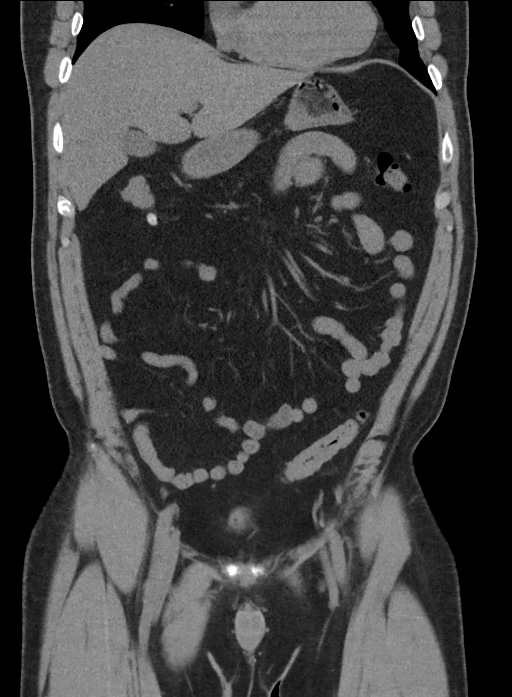
[im 43/97  soft-tissue]
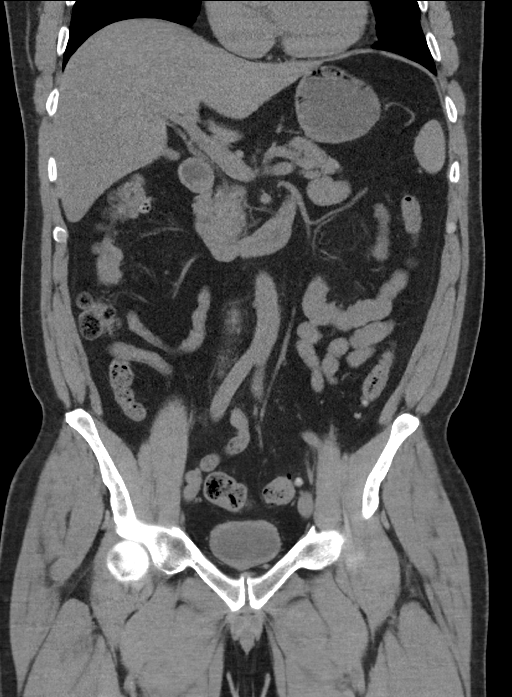
[im 54/97  soft-tissue]
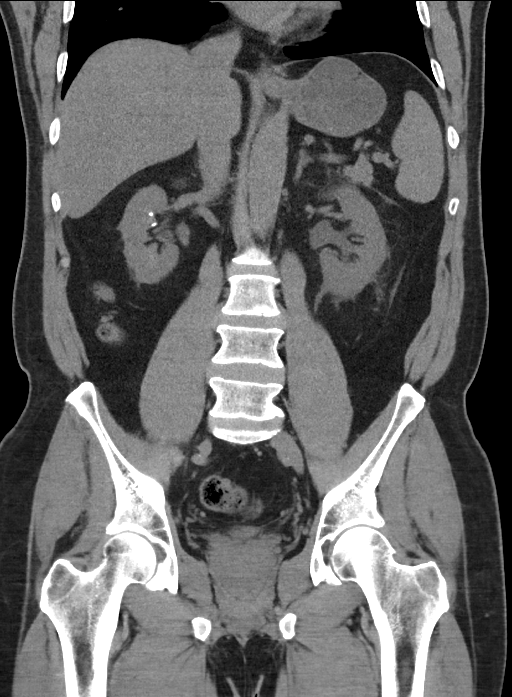

[16 of 46 positions shown; findings below may reference images not displayed]

FINDINGS: Lower chest: The lung bases are clear. The heart size is normal.

Hepatobiliary: There is decreased hepatic attenuation suggestive of
hepatic steatosis. Normal gallbladder.There is no biliary ductal
dilation.

Pancreas: Normal contours without ductal dilatation. No
peripancreatic fluid collection.

Spleen: Unremarkable.

Adrenals/Urinary Tract:

--Adrenal glands: Unremarkable.

--Right kidney/ureter: There are multiple nonobstructing stones
involving the right kidney measuring up to approximately 5 mm.

--Left kidney/ureter: There is mild right-sided hydronephrosis
secondary to obstructing 6 mm stone in the proximal left ureter
(axial series 2, image 47). There are multiple additional
nonobstructing stones in the left kidney.

--Urinary bladder: Unremarkable.

Stomach/Bowel:

--Stomach/Duodenum: No hiatal hernia or other gastric abnormality.
Normal duodenal course and caliber.

--Small bowel: Unremarkable.

--Colon: There are scattered colonic diverticula without CT evidence
for diverticulitis.

--Appendix: Normal.

Vascular/Lymphatic: Normal course and caliber of the major abdominal
vessels.

--No retroperitoneal lymphadenopathy.

--No mesenteric lymphadenopathy.

--No pelvic or inguinal lymphadenopathy.

Reproductive: Unremarkable

Other: No ascites or free air. The abdominal wall is normal.

Musculoskeletal. No acute displaced fractures.
IMPRESSION: 1. Mild left-sided hydronephrosis secondary to obstructing 6 mm
stone in the proximal left ureter.
2. Bilateral nonobstructing nephrolithiasis.
3. Hepatic steatosis.
4. Scattered colonic diverticula without CT evidence for
diverticulitis.

## 2022-10-12 ENCOUNTER — Other Ambulatory Visit: Payer: Self-pay

## 2022-10-12 ENCOUNTER — Other Ambulatory Visit (HOSPITAL_COMMUNITY): Payer: Self-pay

## 2022-10-13 ENCOUNTER — Other Ambulatory Visit: Payer: Self-pay

## 2022-10-19 ENCOUNTER — Encounter: Payer: Self-pay | Admitting: Podiatry

## 2022-10-19 ENCOUNTER — Ambulatory Visit: Payer: Managed Care, Other (non HMO) | Admitting: Podiatry

## 2022-10-19 DIAGNOSIS — R202 Paresthesia of skin: Secondary | ICD-10-CM

## 2022-10-19 NOTE — Progress Notes (Signed)
   Chief Complaint  Patient presents with   Foot Pain    PATIENT STATES FROM FEBRUARY TO JUNE HE HAS BEEN HAVING SHOOTING PAIN FROM HIS SIDE ALL THE WAY DOWN THE LEFT LEG ON TOP OF HS LF AND IT STARTS TO GET RED AND SWELL UP.   Diabetes    PATIENT STATES HE IS TYPE 2 DB    HPI: 62 y.o. male presenting today as a new patient for evaluation of sensitivity associated to the left foot.  Patient states that his left foot feels 'abnormal' and he is unsteady on his left foot.  History of lumbar radiculopathy left lower extremity which ultimately led to surgery.  Patient states that he had complete restoration and alleviation of symptoms after surgery with exception of continued sensitivity to the left foot.  His pain is exacerbated by activity.    Past Medical History:  Diagnosis Date   Diabetes mellitus without complication (HCC)    Kidney stone    No past surgical history on file.  No Known Allergies   Physical Exam: General: The patient is alert and oriented x3 in no acute distress.  Dermatology: Skin is warm, dry and supple bilateral lower extremities.   Vascular: Palpable pedal pulses bilaterally. Capillary refill within normal limits.  No appreciable edema.  No erythema.  Neurological: Grossly intact via light touch.  Tenderness with light touch along the plantar arch of the foot and especially to the lesser digits of the toes.  Musculoskeletal Exam: No pedal deformities noted  DG Foot Complete Left 04/16/2021 FINDINGS: Mild spurring of the first metatarsophalangeal joint. No fracture or dislocation. Soft tissues are normal. IMPRESSION: Mild first metatarsophalangeal joint osteoarthrosis.  MR LUMBAR SPINE WO CONTRAST 05/19/2021 IMPRESSION: 1. L4-5 left paracentral herniation impinging on the left L5 nerve root. 2. Noncompressive degenerative changes are described above.  Assessment/Plan of Care: 1.  Paresthesia with sensitivity plantar aspect of the left foot likely secondary  to permanent residual effects of left lower extremity lumbar radiculopathy  -Patient evaluated.  Patient's chart and past medical history was reviewed in detail -I do believe the patient's sensitivity is associated to his history of lumbar radiculopathy affecting the left lower extremity. -Continue OTC arch supports that he purchased from the [employer] -Recommend OTC nerve vitamin supplements daily -Also recommend topical Voltaren gel as needed -Return to clinic as needed       Felecia Shelling, DPM [facility]  Dr. Felecia Shelling, DPM    [address] [place], [place] [address]                Office [phone]  Fax [phone]

## 2022-11-21 ENCOUNTER — Other Ambulatory Visit (HOSPITAL_COMMUNITY): Payer: Self-pay

## 2022-11-21 MED ORDER — JARDIANCE 10 MG PO TABS
10.0000 mg | ORAL_TABLET | Freq: Every morning | ORAL | 1 refills | Status: AC
  Filled 2022-11-21: qty 90, 90d supply, fill #0

## 2023-05-30 ENCOUNTER — Other Ambulatory Visit (HOSPITAL_COMMUNITY): Payer: Self-pay

## 2023-05-30 MED ORDER — PREGABALIN 75 MG PO CAPS
75.0000 mg | ORAL_CAPSULE | Freq: Three times a day (TID) | ORAL | 3 refills | Status: AC
  Filled 2023-05-30: qty 90, 30d supply, fill #0

## 2023-11-01 ENCOUNTER — Other Ambulatory Visit (HOSPITAL_COMMUNITY): Payer: Self-pay

## 2023-11-01 MED ORDER — RYBELSUS 14 MG PO TABS
14.0000 mg | ORAL_TABLET | Freq: Every day | ORAL | 3 refills | Status: AC
  Filled 2023-11-01: qty 90, 90d supply, fill #0

## 2023-11-01 MED ORDER — JARDIANCE 10 MG PO TABS
10.0000 mg | ORAL_TABLET | Freq: Every morning | ORAL | 3 refills | Status: AC
  Filled 2023-11-01: qty 90, 90d supply, fill #0

## 2023-11-01 MED ORDER — PREGABALIN 25 MG PO CAPS
25.0000 mg | ORAL_CAPSULE | Freq: Three times a day (TID) | ORAL | 3 refills | Status: AC
  Filled 2023-11-01: qty 90, 30d supply, fill #0

## 2023-11-02 ENCOUNTER — Other Ambulatory Visit: Payer: Self-pay

## 2023-11-09 ENCOUNTER — Other Ambulatory Visit (HOSPITAL_COMMUNITY): Payer: Self-pay
